# Patient Record
Sex: Female | Born: 1965 | Race: White | Hispanic: Yes | Marital: Married | Smoking: Never smoker
Health system: Southern US, Community
[De-identification: ages and names within clinical notes are randomized; demographics above are authoritative.]

## PROBLEM LIST (undated history)

## (undated) DIAGNOSIS — O24419 Gestational diabetes mellitus in pregnancy, unspecified control: Secondary | ICD-10-CM

## (undated) DIAGNOSIS — IMO0002 Reserved for concepts with insufficient information to code with codable children: Secondary | ICD-10-CM

## (undated) HISTORY — DX: Gestational diabetes mellitus in pregnancy, unspecified control: O24.419

## (undated) HISTORY — DX: Reserved for concepts with insufficient information to code with codable children: IMO0002

---

## 1998-02-20 ENCOUNTER — Other Ambulatory Visit: Admission: RE | Admit: 1998-02-20 | Discharge: 1998-02-20 | Payer: Self-pay | Admitting: Obstetrics and Gynecology

## 1998-06-02 ENCOUNTER — Other Ambulatory Visit: Admission: RE | Admit: 1998-06-02 | Discharge: 1998-06-02 | Payer: Self-pay | Admitting: Obstetrics and Gynecology

## 1998-11-07 ENCOUNTER — Encounter: Admission: RE | Admit: 1998-11-07 | Discharge: 1999-02-05 | Payer: Self-pay | Admitting: Gynecology

## 1999-02-16 ENCOUNTER — Inpatient Hospital Stay (HOSPITAL_COMMUNITY): Admission: AD | Admit: 1999-02-16 | Discharge: 1999-02-18 | Payer: Self-pay | Admitting: Obstetrics and Gynecology

## 1999-03-31 ENCOUNTER — Other Ambulatory Visit: Admission: RE | Admit: 1999-03-31 | Discharge: 1999-03-31 | Payer: Self-pay | Admitting: Obstetrics and Gynecology

## 1999-07-27 DIAGNOSIS — IMO0002 Reserved for concepts with insufficient information to code with codable children: Secondary | ICD-10-CM

## 1999-07-27 HISTORY — DX: Reserved for concepts with insufficient information to code with codable children: IMO0002

## 2000-04-13 ENCOUNTER — Other Ambulatory Visit: Admission: RE | Admit: 2000-04-13 | Discharge: 2000-04-13 | Payer: Self-pay | Admitting: Obstetrics and Gynecology

## 2000-07-07 ENCOUNTER — Other Ambulatory Visit: Admission: RE | Admit: 2000-07-07 | Discharge: 2000-07-07 | Payer: Self-pay | Admitting: Gynecology

## 2000-07-13 ENCOUNTER — Encounter: Admission: RE | Admit: 2000-07-13 | Discharge: 2000-10-11 | Payer: Self-pay | Admitting: Gynecology

## 2001-01-21 ENCOUNTER — Inpatient Hospital Stay (HOSPITAL_COMMUNITY): Admission: AD | Admit: 2001-01-21 | Discharge: 2001-01-22 | Payer: Self-pay | Admitting: Gynecology

## 2001-02-20 ENCOUNTER — Other Ambulatory Visit: Admission: RE | Admit: 2001-02-20 | Discharge: 2001-02-20 | Payer: Self-pay | Admitting: Gynecology

## 2001-09-19 ENCOUNTER — Other Ambulatory Visit: Admission: RE | Admit: 2001-09-19 | Discharge: 2001-09-19 | Payer: Self-pay | Admitting: Gynecology

## 2004-09-10 ENCOUNTER — Other Ambulatory Visit: Admission: RE | Admit: 2004-09-10 | Discharge: 2004-09-10 | Payer: Self-pay | Admitting: Gynecology

## 2005-07-13 ENCOUNTER — Other Ambulatory Visit: Admission: RE | Admit: 2005-07-13 | Discharge: 2005-07-13 | Payer: Self-pay | Admitting: Gynecology

## 2006-11-24 ENCOUNTER — Other Ambulatory Visit: Admission: RE | Admit: 2006-11-24 | Discharge: 2006-11-24 | Payer: Self-pay | Admitting: Gynecology

## 2009-12-04 ENCOUNTER — Ambulatory Visit: Payer: Self-pay | Admitting: Women's Health

## 2009-12-04 ENCOUNTER — Other Ambulatory Visit: Admission: RE | Admit: 2009-12-04 | Discharge: 2009-12-04 | Payer: Self-pay | Admitting: Gynecology

## 2010-12-07 ENCOUNTER — Encounter (INDEPENDENT_AMBULATORY_CARE_PROVIDER_SITE_OTHER): Payer: BC Managed Care – PPO | Admitting: Women's Health

## 2010-12-07 ENCOUNTER — Other Ambulatory Visit (HOSPITAL_COMMUNITY)
Admission: RE | Admit: 2010-12-07 | Discharge: 2010-12-07 | Disposition: A | Discharge status: Home / Self Care | Payer: BC Managed Care – PPO | Source: Ambulatory Visit | Attending: Gynecology | Admitting: Gynecology

## 2010-12-07 ENCOUNTER — Other Ambulatory Visit: Payer: Self-pay | Admitting: Women's Health

## 2010-12-07 DIAGNOSIS — R82998 Other abnormal findings in urine: Secondary | ICD-10-CM

## 2010-12-07 DIAGNOSIS — Z01419 Encounter for gynecological examination (general) (routine) without abnormal findings: Secondary | ICD-10-CM

## 2010-12-07 DIAGNOSIS — Z833 Family history of diabetes mellitus: Secondary | ICD-10-CM

## 2010-12-07 DIAGNOSIS — Z124 Encounter for screening for malignant neoplasm of cervix: Secondary | ICD-10-CM | POA: Insufficient documentation

## 2010-12-11 NOTE — Discharge Summary (Signed)
[facility]  Patient:    NISREEN, GUISE                      MRN: [id number] Adm. Date:  [id number] Disc. Date: [id number] Attending:  Merrily Pew Dictator:   Antony Contras, [facility]                           Discharge Summary  DISCHARGE DIAGNOSES:          1. Intrauterine pregnancy at term.                               2. Spontaneous onset of labor.  PROCEDURES:                   Normal spontaneous vaginal delivery of                               viable infant over intact perineum.  HISTORY OF PRESENT ILLNESS:   The patient is a 45 year old, gravida 4, para 3-0-0-3 with an EDC of January 28, 2001 by ultrasound. Prenatal course was uncomplicated.  PRENATAL LABORATORY DATA:     Blood type O positive, antibody screen negative. RPR, HBsAg, HIV nonreactive. Rubella immune. MSAFP and GBS negative.  HOSPITAL COURSE AND TREATMENT:                The patient was admitted on January 21, 2001 in active labor. She progressed quickly to complete dilatation and delivered an Apgar 9/9 female infant weighing 7 pounds 9 ounces over an intact perineum.  Postpartum course, she remained afebrile, had no difficulty voiding, and was able to be discharged on her first postpartum day in satisfactory condition.  CBC: Hematocrit 32.8, hemoglobin 11.4, WBCs 10.8, platelets 190,000.  DISPOSITION:                  The patient is to follow up in six weeks, continue with prenatal vitamins and iron, and Motrin and Tylox for pain. DD:  02/24/01 TD:  02/26/01 Job: [id number]

## 2011-10-18 ENCOUNTER — Ambulatory Visit (INDEPENDENT_AMBULATORY_CARE_PROVIDER_SITE_OTHER): Payer: BC Managed Care – PPO | Admitting: Physician Assistant

## 2011-10-18 VITALS — BP 126/77 | HR 71 | Temp 98.1°F | Resp 16 | Ht 65.75 in | Wt 157.0 lb

## 2011-10-18 DIAGNOSIS — T148 Other injury of unspecified body region: Secondary | ICD-10-CM

## 2011-10-18 DIAGNOSIS — W57XXXA Bitten or stung by nonvenomous insect and other nonvenomous arthropods, initial encounter: Secondary | ICD-10-CM

## 2011-10-18 MED ORDER — DOXYCYCLINE HYCLATE 100 MG PO TABS: 100.0000 mg | ORAL_TABLET | Freq: Two times a day (BID) | ORAL | Status: AC

## 2011-10-18 MED ORDER — MUPIROCIN 2 % EX OINT: TOPICAL_OINTMENT | Freq: Three times a day (TID) | CUTANEOUS | Status: AC

## 2011-10-18 NOTE — Progress Notes (Signed)
  Subjective:    Patient ID: Erika Tran, female    DOB: Aug 11, 1965, 46 y.o.   MRN: [id number]  HPI Ms. Leikam comes in today with a ? Insect bite on left forearm.  Was traveling to [place] last week with school field trip.  Noticed a red spot on arm 4 days ago.  Mostly itching.  Little pain.  No fever or chills.  No drainage from area.   Review of Systems As above    Objective:   Physical Exam Left forearm  Erythematous quarter sized area with central scabbed leison.  Slightly raised.  Deroofed scab and no drainage seen. No streaking.       Assessment & Plan:  Insect bite, left forearm.  I do not think there is any cellulitis at this point.  Zyrtec recommended.  Bactroban ointment bid tid.  RX for Doxy given as patient is traveling to [place] for several days with other child's class.  Area marked with pen and pt is to watch borders.  Call if sx worsen

## 2011-10-29 ENCOUNTER — Ambulatory Visit (INDEPENDENT_AMBULATORY_CARE_PROVIDER_SITE_OTHER): Payer: BC Managed Care – PPO | Admitting: Family Medicine

## 2011-10-29 VITALS — BP 104/67 | HR 86 | Temp 99.5°F | Resp 16 | Ht 65.75 in | Wt 155.6 lb

## 2011-10-29 DIAGNOSIS — R1110 Vomiting, unspecified: Secondary | ICD-10-CM

## 2011-10-29 DIAGNOSIS — R112 Nausea with vomiting, unspecified: Secondary | ICD-10-CM

## 2011-10-29 DIAGNOSIS — E860 Dehydration: Secondary | ICD-10-CM

## 2011-10-29 DIAGNOSIS — R197 Diarrhea, unspecified: Secondary | ICD-10-CM

## 2011-10-29 LAB — POCT CBC
Granulocyte percent: 71.9 %G (ref 37–80)
HCT, POC: 42.8 % (ref 37.7–47.9)
Hemoglobin: 14.3 g/dL (ref 12.2–16.2)
Lymph, poc: 1.7 (ref 0.6–3.4)
MCH, POC: 29.1 pg (ref 27–31.2)
MCHC: 33.4 g/dL (ref 31.8–35.4)
MCV: 87.2 fL (ref 80–97)
MID (cbc): 0.5 (ref 0–0.9)
MPV: 8.7 fL (ref 0–99.8)
POC Granulocyte: 5.8 (ref 2–6.9)
POC LYMPH PERCENT: 21.7 %L (ref 10–50)
POC MID %: 6.4 %M (ref 0–12)
Platelet Count, POC: 284 10*3/uL (ref 142–424)
RBC: 4.91 M/uL (ref 4.04–5.48)
RDW, POC: 13.1 %
WBC: 8 10*3/uL (ref 4.6–10.2)

## 2011-10-29 MED ORDER — ONDANSETRON 4 MG PO TBDP
4.0000 mg | ORAL_TABLET | Freq: Once | ORAL | Status: AC
  Administered 2011-10-29: 4 mg via ORAL

## 2011-10-29 MED ORDER — ONDANSETRON 8 MG PO TBDP: 8.0000 mg | ORAL_TABLET | Freq: Three times a day (TID) | ORAL | Status: AC | PRN

## 2011-10-29 MED ORDER — SODIUM CHLORIDE 0.9 % IV SOLN: 10000.0000 mL | Freq: Once | INTRAVENOUS | Status: DC

## 2011-10-29 NOTE — Patient Instructions (Signed)
Nausea and Vomiting  Nausea is a sick feeling that often comes before throwing up (vomiting). Vomiting is a reflex where stomach contents come out of your mouth. Vomiting can cause severe loss of body fluids (dehydration). Children and elderly adults can become dehydrated quickly, especially if they also have diarrhea. Nausea and vomiting are symptoms of a condition or disease. It is important to find the cause of your symptoms.  CAUSES    Direct irritation of the stomach lining. This irritation can result from increased acid production (gastroesophageal reflux disease), infection, food poisoning, taking certain medicines (such as nonsteroidal anti-inflammatory drugs), alcohol use, or tobacco use.   Signals from the brain.These signals could be caused by a headache, heat exposure, an inner ear disturbance, increased pressure in the brain from injury, infection, a tumor, or a concussion, pain, emotional stimulus, or metabolic problems.   An obstruction in the gastrointestinal tract (bowel obstruction).   Illnesses such as diabetes, hepatitis, gallbladder problems, appendicitis, kidney problems, cancer, sepsis, atypical symptoms of a heart attack, or eating disorders.   Medical treatments such as chemotherapy and radiation.   Receiving medicine that makes you sleep (general anesthetic) during surgery.  DIAGNOSIS  Your caregiver may ask for tests to be done if the problems do not improve after a few days. Tests may also be done if symptoms are severe or if the reason for the nausea and vomiting is not clear. Tests may include:   Urine tests.   Blood tests.   Stool tests.   Cultures (to look for evidence of infection).   X-rays or other imaging studies.  Test results can help your caregiver make decisions about treatment or the need for additional tests.  TREATMENT  You need to stay well hydrated. Drink frequently but in small amounts.You may wish to drink water, sports drinks, clear broth, or eat frozen  ice pops or gelatin dessert to help stay hydrated.When you eat, eating slowly may help prevent nausea.There are also some antinausea medicines that may help prevent nausea.  HOME CARE INSTRUCTIONS    Take all medicine as directed by your caregiver.   If you do not have an appetite, do not force yourself to eat. However, you must continue to drink fluids.   If you have an appetite, eat a normal diet unless your caregiver tells you differently.   Eat a variety of complex carbohydrates (rice, wheat, potatoes, bread), lean meats, yogurt, fruits, and vegetables.   Avoid high-fat foods because they are more difficult to digest.   Drink enough water and fluids to keep your urine clear or pale yellow.   If you are dehydrated, ask your caregiver for specific rehydration instructions. Signs of dehydration may include:   Severe thirst.   Dry lips and mouth.   Dizziness.   Dark urine.   Decreasing urine frequency and amount.   Confusion.   Rapid breathing or pulse.  SEEK IMMEDIATE MEDICAL CARE IF:    You have blood or brown flecks (like coffee grounds) in your vomit.   You have black or bloody stools.   You have a severe headache or stiff neck.   You are confused.   You have severe abdominal pain.   You have chest pain or trouble breathing.   You do not urinate at least once every 8 hours.   You develop cold or clammy skin.   You continue to vomit for longer than 24 to 48 hours.   You have a fever.  MAKE SURE YOU:      Understand these instructions.   Will watch your condition.   Will get help right away if you are not doing well or get worse.  Document Released: 07/12/2005 Document Revised: 07/01/2011 Document Reviewed: 12/09/2010  ExitCare Patient Information 2012 [employer].

## 2011-10-29 NOTE — Progress Notes (Signed)
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  Developed acute n,v and diarrhea yesterday afternoon.  No blood in stool.  Vomited just before arriving here around 2 pm.  Feels a little light headed.  O:  Alert, NAD BP:  90/70 sitting;  70/0 standing. Chest:  Clear Heart:  Reg, no murmur or gallop Abd: soft, nontender with no HSM Skin: some tenting on back of hand, no jaundice  A: sx c/w norovirus;  Dehydrated.  P:

## 2012-02-11 ENCOUNTER — Encounter: Payer: Self-pay | Admitting: Women's Health

## 2012-02-11 ENCOUNTER — Telehealth: Payer: Self-pay | Admitting: Gynecology

## 2012-02-11 ENCOUNTER — Ambulatory Visit (INDEPENDENT_AMBULATORY_CARE_PROVIDER_SITE_OTHER): Payer: BC Managed Care – PPO | Admitting: Women's Health

## 2012-02-11 VITALS — BP 110/70 | Ht 66.25 in | Wt 156.0 lb

## 2012-02-11 DIAGNOSIS — Z01419 Encounter for gynecological examination (general) (routine) without abnormal findings: Secondary | ICD-10-CM

## 2012-02-11 DIAGNOSIS — Z13220 Encounter for screening for lipoid disorders: Secondary | ICD-10-CM

## 2012-02-11 DIAGNOSIS — Z833 Family history of diabetes mellitus: Secondary | ICD-10-CM

## 2012-02-11 DIAGNOSIS — E079 Disorder of thyroid, unspecified: Secondary | ICD-10-CM

## 2012-02-11 LAB — CBC WITH DIFFERENTIAL/PLATELET
Basophils Absolute: 0 10*3/uL (ref 0.0–0.1)
Basophils Relative: 0 % (ref 0–1)
Eosinophils Relative: 3 % (ref 0–5)
HCT: 39.4 % (ref 36.0–46.0)
Hemoglobin: 13.5 g/dL (ref 12.0–15.0)
Lymphocytes Relative: 33 % (ref 12–46)
MCHC: 34.3 g/dL (ref 30.0–36.0)
MCV: 85.3 fL (ref 78.0–100.0)
Monocytes Absolute: 0.4 10*3/uL (ref 0.1–1.0)
Monocytes Relative: 6 % (ref 3–12)
RDW: 13 % (ref 11.5–15.5)

## 2012-02-11 LAB — LIPID PANEL
Cholesterol: 131 mg/dL (ref 0–200)
HDL: 49 mg/dL (ref >39)
Triglycerides: 43 mg/dL (ref <150)
VLDL: 9 mg/dL (ref 0–40)

## 2012-02-11 NOTE — Progress Notes (Signed)
Erika Tran November 01, 1965 [id number]    History:    The patient presents for annual exam. Light 2-3 day cycle most months/Mirena IUD-placed 2008. Hx normal Paps. Never had mammogram. Reports 20 lb. weight gain over past 2-3 years, contributes to diet and stress.    Past medical history, past surgical history, family history and social history were all reviewed and documented in the EPIC chart.  Hx GDM 3rd pregnancy. Stay at home mom, cleans homes. 4 children, oldest at [employer] (rising sophomore).    ROS:  A  ROS was performed and pertinent positives and negatives are included in the history.  Exam:  Filed Vitals:   02/11/12 0807  BP: 110/70    General appearance:  Normal Head/Neck:  Normal, without cervical or supraclavicular adenopathy. Thyroid:  Symmetrical, normal in size, without palpable masses or nodularity. Respiratory  Effort:  Normal  Auscultation:  Clear without wheezing or rhonchi Cardiovascular  Auscultation:  Regular rate, without rubs, murmurs or gallops  Edema/varicosities:  Not grossly evident Abdominal  Soft,nontender, without masses, guarding or rebound.  Liver/spleen:  No organomegaly noted  Hernia:  None appreciated  Skin  Inspection:  Grossly normal  Palpation:  Grossly normal Neurologic/psychiatric  Orientation:  Normal with appropriate conversation.  Mood/affect:  Normal  Genitourinary    Breasts: Examined lying and sitting.     Right: Without masses, retractions, discharge or axillary adenopathy.     Left: Without masses, retractions, discharge or axillary adenopathy.   Inguinal/mons:  Normal without inguinal adenopathy  External genitalia:  Normal  BUS/Urethra/Skene's glands:  Normal  Bladder:  Normal  Vagina:  Normal  Cervix:  Normal, Blue Mirena IUD strings visible.   Uterus:  Normal in size, shape and contour.  Midline and mobile  Adnexa/parametria:     Rt: Without masses or tenderness.   Lt: Without masses or tenderness.  Anus and  perineum: Normal  Digital rectal exam: Normal sphincter tone without palpated masses or tenderness  Assessment/Plan:  46 y.o.  MHF G4P4 for annual exam with complaints of gradual weight gain over past 2-3 years.  Normal gyn exam/Mirena IUD-placed 2008 Weight Gain-over past 2-3 years  Plan: CBC, U/A, TSH, Glu, Lipid panel. No Pap, new screening guidelines reviewed. Has never had mammogram, instructed to schedule and encouraged yearly mammograms. Daily exercise, cutting calories and carbs for weight loss, SBE's, Vit. D 1000  daily, and Ca rich diet encouraged. Gardasil vaccine reviewed for children.  Mirena IUD due to be removed/replaced, will schedule with Dr. Audie Box.     Harrington Challenger [facility], 8:48 AM 02/11/2012

## 2012-02-11 NOTE — Telephone Encounter (Signed)
Patient informed that I spoke with [place] at [facility] and was told her new IUD and insertion covered at 100%.  However, there will be a $25 copayment for the removal of the IUD she has in.  Appt is schedule for 02/29/12.

## 2012-02-11 NOTE — Patient Instructions (Signed)

## 2012-02-12 LAB — URINALYSIS W MICROSCOPIC + REFLEX CULTURE
Glucose, UA: NEGATIVE mg/dL
Hgb urine dipstick: NEGATIVE
Leukocytes, UA: NEGATIVE
Nitrite: NEGATIVE
Protein, ur: NEGATIVE mg/dL
Squamous Epithelial / LPF: NONE SEEN
Urobilinogen, UA: 0.2 mg/dL (ref 0.0–1.0)

## 2012-02-15 ENCOUNTER — Telehealth: Payer: Self-pay | Admitting: Women's Health

## 2012-02-15 ENCOUNTER — Other Ambulatory Visit: Payer: Self-pay | Admitting: Gynecology

## 2012-02-15 DIAGNOSIS — Z3049 Encounter for surveillance of other contraceptives: Secondary | ICD-10-CM

## 2012-02-15 MED ORDER — LEVONORGESTREL 20 MCG/24HR IU IUD: INTRAUTERINE_SYSTEM | Freq: Once | INTRAUTERINE | Status: AC

## 2012-02-15 NOTE — Telephone Encounter (Signed)
Patient said she has a "baby hernia from having a baby".  She said at the visit you asked her did it bother her and she said no.  However, she has since noticed that it does and the last few days more than usual and thinking back she realized it had bothered her at other times.  She wondered if she needed to have it checked by someone and if you thought that was a good idea?  She asked if you would be calling her and I told her you may just have me call her back as probably next step will be to go see the general surgeon and let him check it to see if it looks like it needs surgical repair or you may have time to call her. She is fine with either. Patient knows [place] off today and we will call her tomorrow.

## 2012-02-16 NOTE — Telephone Encounter (Signed)
Message left on cell and home number

## 2012-02-17 NOTE — Telephone Encounter (Signed)
message left on cell

## 2012-02-17 NOTE — Telephone Encounter (Signed)
Telephone call, states has had slight discomfort at umbilicus yesterday, none today. Reviewed if would have continued discomfort, burning sensation, aching then would need to see a general surgeon for possible hernia repair. Denies any problems with constipation or changes in elimination or nausea. Will watch at this time but will call if continued problems.

## 2012-02-29 ENCOUNTER — Ambulatory Visit: Payer: BC Managed Care – PPO | Admitting: Gynecology

## 2012-03-13 ENCOUNTER — Ambulatory Visit (INDEPENDENT_AMBULATORY_CARE_PROVIDER_SITE_OTHER): Payer: BC Managed Care – PPO | Admitting: Gynecology

## 2012-03-13 ENCOUNTER — Encounter: Payer: Self-pay | Admitting: Gynecology

## 2012-03-13 ENCOUNTER — Ambulatory Visit: Payer: BC Managed Care – PPO | Admitting: Gynecology

## 2012-03-13 DIAGNOSIS — Z3049 Encounter for surveillance of other contraceptives: Secondary | ICD-10-CM

## 2012-03-13 NOTE — Progress Notes (Signed)
Patient presents to have her Mirena IUD were placed. Unfortunately she had it placed May 2008 it has had intercourse 2 nights ago. She is not on a menses now. Discussed with her that I am uncomfortable replacing it now past her due date which is not on her menses and had recent intercourse. She will need to abstain at least 2 weeks from intercourse and then check a pregnancy test or represent during a regular menses flow to have this replaced. I reviewed with her the past 35 years we cannot guarantee contraceptive efficacy. The patient understands and will reschedule.

## 2012-03-13 NOTE — Patient Instructions (Signed)
Call if any problems. Otherwise follow up in one month for IUD recheck.

## 2012-03-17 ENCOUNTER — Ambulatory Visit: Payer: BC Managed Care – PPO | Admitting: Gynecology

## 2012-03-28 ENCOUNTER — Telehealth: Payer: Self-pay | Admitting: *Deleted

## 2012-03-28 ENCOUNTER — Encounter: Payer: Self-pay | Admitting: Gynecology

## 2012-03-28 ENCOUNTER — Ambulatory Visit (INDEPENDENT_AMBULATORY_CARE_PROVIDER_SITE_OTHER): Payer: BC Managed Care – PPO | Admitting: Gynecology

## 2012-03-28 DIAGNOSIS — K429 Umbilical hernia without obstruction or gangrene: Secondary | ICD-10-CM

## 2012-03-28 DIAGNOSIS — Z30433 Encounter for removal and reinsertion of intrauterine contraceptive device: Secondary | ICD-10-CM

## 2012-03-28 DIAGNOSIS — N912 Amenorrhea, unspecified: Secondary | ICD-10-CM

## 2012-03-28 NOTE — Progress Notes (Signed)
Patient presents for Mirena IUD removal and replacement. She has read through the booklet, has no contraindications and signed the consent form. She remains amenorrheic. She has a negative UPT today after over 2 weeks of abstinence.  I reviewed the insertional process with her as well as the risks to include infection either immediate or long-term, uterine perforation or migration requiring surgery to remove, other complications such as pain, hormonal side effects and possibilities of failure with subsequent pregnancy.    Exam with Sherrilyn Rist assistant Pelvic: External BUS vagina normal. Cervix normal IUD string visualized. Uterus axial, normal size shape contour midline mobile nontender. Adnexa without masses or tenderness.  Procedure: The cervix was cleansed with Betadine and the IUD string was grasped with a [place] forcep and the old IUD was removed, shown to the patient and discarded. The anterior lip of the cervix was grasped with a single-tooth tenaculum, the uterus was sounded and a new Mirena IUD was placed according to manufacturer's recommendations without difficulty. The strings were trimmed. The patient tolerated well and will follow up in one month for a postinsertional check.  Lot number:[id number]

## 2012-03-28 NOTE — Telephone Encounter (Signed)
App. Set for 04/03/12 @ 2:10 pm with dr. Daphine Deutscher at [facility] [place] [facility]. Pt mail box full unable to leave message will try back later.

## 2012-03-28 NOTE — Telephone Encounter (Signed)
Message copied by Aura Camps on Tue Mar 28, 2012 12:44 PM ------      Message from: Dara Lords      Created: Tue Mar 28, 2012  9:50 AM       Schedule appt with general surgeon ie Tawanna Cooler Rosenbauer/ Todd Gerkin/Matt Highland reference umbilical hernia

## 2012-03-28 NOTE — Patient Instructions (Signed)
Intrauterine Device Insertion Most often, an intrauterine device (IUD) is inserted into the uterus to prevent pregnancy. There are 2 types of IUDs available:  Copper IUD. This type of IUD creates an environment that is not favorable to sperm survival. The mechanism of action of the copper IUD is not known for certain. It can stay in place for 10 years.   Hormone IUD. This type of IUD contains the hormone progestin (synthetic progesterone). The progestin thickens the cervical mucus and prevents sperm from entering the uterus, and it also thins the uterine lining. There is no evidence that the hormone IUD prevents implantation. The hormone IUD can stay in place for up to 5 years.  An IUD is the most cost-effective birth control if left in place for the full duration. It may be removed at any time. LET YOUR CAREGIVER KNOW ABOUT:  Sensitivity to metals.   Medicines taken including herbs, eyedrops, over-the-counter medicines, and creams.   Use of steroids (by mouth or creams).   Previous problems with anesthetics or numbing medicine.   Previous gynecological surgery.   History of blood clots or clotting disorders.   Possibility of pregnancy.   Menstrual irregularities.   Concerns regarding unusual vaginal discharge or odors.   Previous experience with an IUD.   Other health problems.  RISKS AND COMPLICATIONS  Accidental puncture (perforation) of the uterus.   Accidental placement of the IUD either in the muscle layer of the uterus (myometrium) or outside the uterus. If this happen, the IUD can be found essentially floating around the bowels. When this happens, the IUD must be taken out surgically.   The IUD may fall out of the uterus (expulsion). This is more common in women who have recently had a child.    Pregnancy in the fallopian tube (ectopic).  BEFORE THE PROCEDURE  Schedule the IUD insertion for when you will have your menstrual period or right after, to make sure you  are not pregnant. Placement of the IUD is better tolerated shortly after a menstrual cycle.   You may need to take tests or be examined to make sure you are not pregnant.   You may be required to take a pregnancy test.   You may be required to get checked for sexually transmitted infections (STIs) prior to placement. Placing an IUD in someone who has an infection can make an infection worse.   You may be given a pain reliever to take 1 or 2 hours before the procedure.   An exam will be performed to determine the size and position of your uterus.   Ask your caregiver about changing or stopping your regular medicines.  PROCEDURE   A tool (speculum) is placed in the vagina. This allows your caregiver to see the lower part of the uterus (cervix).   The cervix is prepped with a medicine that lowers the risk of infection.   You may be given a medicine to numb each side of the cervix (intracervical or paracervical block). This is used to block and control any discomfort with insertion.   A tool (uterine sound) is inserted into the uterus to determine the length of the uterine cavity and the direction the uterus may be tilted.   A slim instrument (IUD inserter) is inserted through the cervical canal and into your uterus.   The IUD is placed in the uterine cavity and the insertion device is removed.   The nylon string that is attached to the IUD, and used for   eventual IUD removal, is trimmed. It is trimmed so that it lays high in the vagina, just outside the cervix.  AFTER THE PROCEDURE  You may have bleeding after the procedure. This is normal. It varies from light spotting for a few days to menstrual-like bleeding.   You may have mild cramping.   Practice checking the string coming out of the cervix to make sure the IUD remains in the uterus. If you cannot feel the string, you should schedule a "string check" with your caregiver.   If you had a hormone IUD inserted, expect that your  period may be lighter or nonexistent within a year's time (though this is not always the case). There may be delayed fertility with the hormone IUD as a result of its progesterone effect. When you are ready to become pregnant, it is suggested to have the IUD removed up to 1 year in advance.   Yearly exams are advised.  Document Released: 03/10/2011 Document Revised: 07/01/2011 Document Reviewed: 03/10/2011 ExitCare Patient Information 2012 [employer]. 

## 2012-03-29 NOTE — Telephone Encounter (Signed)
Left message for pt to call.

## 2012-04-03 ENCOUNTER — Encounter (INDEPENDENT_AMBULATORY_CARE_PROVIDER_SITE_OTHER): Payer: Self-pay | Admitting: Surgery

## 2012-04-03 ENCOUNTER — Ambulatory Visit (INDEPENDENT_AMBULATORY_CARE_PROVIDER_SITE_OTHER): Payer: BC Managed Care – PPO | Admitting: Surgery

## 2012-04-03 VITALS — BP 116/78 | HR 64 | Temp 97.2°F | Resp 12 | Ht 67.0 in | Wt 157.4 lb

## 2012-04-03 DIAGNOSIS — K429 Umbilical hernia without obstruction or gangrene: Secondary | ICD-10-CM | POA: Insufficient documentation

## 2012-04-03 HISTORY — DX: Umbilical hernia without obstruction or gangrene: K42.9

## 2012-04-03 NOTE — Telephone Encounter (Signed)
Leave message on pt voice mail with the below time & date with # to reschedule appointment if needed.

## 2012-04-03 NOTE — Progress Notes (Signed)
CC  Umbilical hernia  Erika Tran comes in today in followup. She is self-employed and cleans houses. She noticed with her weight going up a little bit that there is an umbilical hernia but a little more prominent. She is seeing Dr. Chiquita Loth and his nurse practitioner Orlan Leavens about this and he referred her for evaluation. She denies any history of nausea or vomiting. She's had 4 children vaginal births.  On examination there is a soft mass about a centimeter in diameter consistent with preperitoneal fat probably come out through a much smaller fascial defect. This is likely too small to accommodate any knuckle of bowel. I told her that it is not bothering her I would tend to manage this expectantly. If he gets larger and we can certainly repair it likely using a piece of mesh. I told her what to look for in terms of bowel obstruction with nausea or vomiting into CT medical attention promptly should that occur.  Impression a small umbilical hernia containing fat. Will observe at the present. Will repair if it becomes more symptomatic or gets larger.

## 2012-04-03 NOTE — Patient Instructions (Addendum)
Thanks for your patience.  If you need further assistance after leaving the office, please call our office and speak with a [facility] nurse.  [phone].  If you want to leave a message for Dr. Pete Schnitzer, please call his office phone at [phone]. 

## 2012-04-27 ENCOUNTER — Ambulatory Visit: Payer: BC Managed Care – PPO | Admitting: Gynecology

## 2012-05-03 ENCOUNTER — Ambulatory Visit: Payer: BC Managed Care – PPO | Admitting: Gynecology

## 2012-08-11 ENCOUNTER — Ambulatory Visit (INDEPENDENT_AMBULATORY_CARE_PROVIDER_SITE_OTHER): Payer: BC Managed Care – PPO | Admitting: Family Medicine

## 2012-08-11 ENCOUNTER — Ambulatory Visit: Payer: BC Managed Care – PPO

## 2012-08-11 VITALS — BP 122/88 | HR 74 | Temp 98.6°F | Resp 16 | Ht 65.5 in | Wt 151.4 lb

## 2012-08-11 DIAGNOSIS — R05 Cough: Secondary | ICD-10-CM

## 2012-08-11 DIAGNOSIS — R0600 Dyspnea, unspecified: Secondary | ICD-10-CM

## 2012-08-11 DIAGNOSIS — J189 Pneumonia, unspecified organism: Secondary | ICD-10-CM

## 2012-08-11 DIAGNOSIS — R0609 Other forms of dyspnea: Secondary | ICD-10-CM

## 2012-08-11 DIAGNOSIS — R062 Wheezing: Secondary | ICD-10-CM

## 2012-08-11 DIAGNOSIS — R059 Cough, unspecified: Secondary | ICD-10-CM

## 2012-08-11 LAB — POCT CBC
Granulocyte percent: 69.8 %G (ref 37–80)
HCT, POC: 45.2 % (ref 37.7–47.9)
Hemoglobin: 14.3 g/dL (ref 12.2–16.2)
Lymph, poc: 2.6 (ref 0.6–3.4)
MCH, POC: 28.4 pg (ref 27–31.2)
MCHC: 31.6 g/dL — AB (ref 31.8–35.4)
MCV: 89.8 fL (ref 80–97)
MID (cbc): 0.8 (ref 0–0.9)
MPV: 9 fL (ref 0–99.8)
POC Granulocyte: 7.7 — AB (ref 2–6.9)
POC LYMPH PERCENT: 23 %L (ref 10–50)
POC MID %: 7.2 %M (ref 0–12)
Platelet Count, POC: 452 10*3/uL — AB (ref 142–424)
RBC: 5.03 M/uL (ref 4.04–5.48)
RDW, POC: 12.9 %
WBC: 11.1 10*3/uL — AB (ref 4.6–10.2)

## 2012-08-11 MED ORDER — ALBUTEROL SULFATE HFA 108 (90 BASE) MCG/ACT IN AERS: 2.0000 | INHALATION_SPRAY | RESPIRATORY_TRACT | Status: DC | PRN

## 2012-08-11 MED ORDER — CEFDINIR 300 MG PO CAPS: 300.0000 mg | ORAL_CAPSULE | Freq: Two times a day (BID) | ORAL | Status: DC

## 2012-08-11 MED ORDER — ALBUTEROL SULFATE (2.5 MG/3ML) 0.083% IN NEBU: 2.5000 mg | INHALATION_SOLUTION | Freq: Once | RESPIRATORY_TRACT | Status: DC

## 2012-08-11 MED ORDER — HYDROCODONE-HOMATROPINE 5-1.5 MG/5ML PO SYRP: 5.0000 mL | ORAL_SOLUTION | Freq: Three times a day (TID) | ORAL | Status: DC | PRN

## 2012-08-11 MED ORDER — LEVALBUTEROL HCL 0.63 MG/3ML IN NEBU
0.6300 mg | INHALATION_SOLUTION | Freq: Once | RESPIRATORY_TRACT | Status: AC
  Administered 2012-08-11: 0.63 mg via RESPIRATORY_TRACT

## 2012-08-11 NOTE — Progress Notes (Signed)
  Subjective:    Patient ID: Erika Tran, female    DOB: 10-16-1965, 47 y.o.   MRN: [id number]  HPI  47 year old female presents with 1 week history of cough, slight nasal congestion, shortness of breath, and wheezing.  States on 08/03/12 she believes she got the flu - had high fever, body aches, chills, with cough.  Says fever broke about 3 days ago, but the cough and shortness of breath have persisted.  She is afebrile today.  Has been taking Dayquil and using lozenges for her sore throat, but symptoms have persisted.  She is otherwise healthy without any other concerns today.     Review of Systems  Constitutional: Negative for fever and chills.  HENT: Positive for congestion, sore throat and rhinorrhea. Negative for ear pain.   Respiratory: Positive for cough, shortness of breath and wheezing. Negative for chest tightness.   Cardiovascular: Negative for chest pain.  Gastrointestinal: Negative for nausea, vomiting and abdominal pain.  Skin: Negative for rash.  Neurological: Negative for dizziness and headaches.       Objective:   Physical Exam  Constitutional: She is oriented to person, place, and time. She appears well-developed and well-nourished.  HENT:  Head: Normocephalic and atraumatic.  Right Ear: External ear normal.  Left Ear: External ear normal.  Mouth/Throat: Oropharynx is clear and moist. No oropharyngeal exudate.  Eyes: Conjunctivae normal are normal.  Neck: Normal range of motion.  Cardiovascular: Normal rate, regular rhythm and normal heart sounds.   Pulmonary/Chest: Effort normal. She has wheezes.  Lymphadenopathy:    She has no cervical adenopathy.  Neurological: She is alert and oriented to person, place, and time.  Psychiatric: She has a normal mood and affect. Her behavior is normal. Judgment and thought content normal.      [facility] reading (PRIMARY) by  Dr. Patsy Lager as mild RLL pneumonia.       Assessment & Plan:   1. Cough  POCT CBC, DG Chest 2 View    2. Dyspnea  DG Chest 2 View  3. Wheezing  DISCONTINUED: albuterol (PROVENTIL) (2.5 MG/3ML) 0.083% nebulizer solution 2.5 mg   Omnicef 300 mg bid x 10 days Albuterol inhaler q4-6hours prn wheezing Hycodan qhs prn cough Follow up if symptoms worsen or fail to improve, specifically if she develops fever, chills, or increasing dyspnea.

## 2013-10-30 ENCOUNTER — Other Ambulatory Visit (HOSPITAL_COMMUNITY)
Admission: RE | Admit: 2013-10-30 | Discharge: 2013-10-30 | Disposition: A | Discharge status: Home / Self Care | Payer: Managed Care, Other (non HMO) | Source: Ambulatory Visit | Attending: Gynecology | Admitting: Gynecology

## 2013-10-30 ENCOUNTER — Other Ambulatory Visit: Payer: Self-pay

## 2013-10-30 ENCOUNTER — Encounter: Payer: Self-pay | Admitting: Women's Health

## 2013-10-30 ENCOUNTER — Ambulatory Visit (INDEPENDENT_AMBULATORY_CARE_PROVIDER_SITE_OTHER): Payer: Managed Care, Other (non HMO) | Admitting: Women's Health

## 2013-10-30 VITALS — BP 120/74 | Ht 66.0 in | Wt 154.0 lb

## 2013-10-30 DIAGNOSIS — Z8632 Personal history of gestational diabetes: Secondary | ICD-10-CM

## 2013-10-30 DIAGNOSIS — Z01419 Encounter for gynecological examination (general) (routine) without abnormal findings: Secondary | ICD-10-CM | POA: Insufficient documentation

## 2013-10-30 DIAGNOSIS — Z833 Family history of diabetes mellitus: Secondary | ICD-10-CM

## 2013-10-30 DIAGNOSIS — Z1231 Encounter for screening mammogram for malignant neoplasm of breast: Secondary | ICD-10-CM

## 2013-10-30 HISTORY — DX: Personal history of gestational diabetes: Z86.32

## 2013-10-30 LAB — CBC WITH DIFFERENTIAL/PLATELET
BASOS PCT: 0 % (ref 0–1)
Basophils Absolute: 0 10*3/uL (ref 0.0–0.1)
EOS ABS: 0.1 10*3/uL (ref 0.0–0.7)
Eosinophils Relative: 2 % (ref 0–5)
HCT: 39.9 % (ref 36.0–46.0)
HEMOGLOBIN: 13.9 g/dL (ref 12.0–15.0)
Lymphocytes Relative: 33 % (ref 12–46)
Lymphs Abs: 2.1 10*3/uL (ref 0.7–4.0)
MCH: 29.3 pg (ref 26.0–34.0)
MCHC: 34.8 g/dL (ref 30.0–36.0)
MCV: 84 fL (ref 78.0–100.0)
MONOS PCT: 7 % (ref 3–12)
Monocytes Absolute: 0.5 10*3/uL (ref 0.1–1.0)
NEUTROS PCT: 58 % (ref 43–77)
Neutro Abs: 3.8 10*3/uL (ref 1.7–7.7)
Platelets: 312 10*3/uL (ref 150–400)
RBC: 4.75 MIL/uL (ref 3.87–5.11)
RDW: 13.4 % (ref 11.5–15.5)
WBC: 6.5 10*3/uL (ref 4.0–10.5)

## 2013-10-30 LAB — GLUCOSE, RANDOM: GLUCOSE: 72 mg/dL (ref 70–99)

## 2013-10-30 NOTE — Progress Notes (Signed)
Erika Tran 09/11/1965 [id number]    History:    Presents for annual exam.  Rare bleeding on Mirena IUD placed 03/2012. History of LGSIL 2001 with normal Paps after. Gestational diabetes with one pregnancy. Had pneumonia 2014. Has not had a mammogram.  Past medical history, past surgical history, family history and social history were all reviewed and documented in the EPIC chart.  4 children son graduating from [employer] doing a semester in [place], one at [employer], 2 in middle school. 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 Father hypertension. Paternal grandmother breast cancer. Excellent lipid panel 2013.  ROS:  A  ROS was performed and pertinent positives and negatives are included.  Exam:  Filed Vitals:   10/30/13 0853  BP: 120/74    General appearance:  Normal Thyroid:  Symmetrical, normal in size, without palpable masses or nodularity. Respiratory  Auscultation:  Clear without wheezing or rhonchi Cardiovascular  Auscultation:  Regular rate, without rubs, murmurs or gallops  Edema/varicosities:  Not grossly evident Abdominal  Soft,nontender, without masses, guarding or rebound.  Liver/spleen:  No organomegaly noted  Hernia:  Small umbilical hernia/nontender  Skin  Inspection:  Grossly normal/or melasama   Breasts: Examined lying and sitting.     Right: Without masses, retractions, discharge or axillary adenopathy.     Left: Without masses, retractions, discharge or axillary adenopathy. Gentitourinary   Inguinal/mons:  Normal without inguinal adenopathy  External genitalia:  Normal  BUS/Urethra/Skene's glands:  Normal  Vagina:  Normal  Cervix:  Normal IUD string in os  Uterus:  normal in size, shape and contour.  Midline and mobile  Adnexa/parametria:     Rt: Without masses or tenderness.   Lt: Without masses or tenderness.  Anus and perineum: Normal  Digital rectal exam: Normal sphincter tone without palpated masses or tenderness  Assessment/Plan:  48 y.o. MWF G4P4 for  annual exam.    Rare bleeding Mirena IUD placed 03/2012 Pneumonia 2014 LGSIL 2001  Plan: Having skin changes/melasma,  continue dermatologist recommendations. Options reviewed to have IUD removed,ParaGard placed will think about. SBE's, reviewed importance of annual screening mammogram and instructed to schedule ASAP. Continue active lifestyle, calcium rich diet, vitamin D 1000 daily encouraged. CBC, glucose, UA, Pap with HR HPV typing.    Harrington ChallengerYOUNG,NANCY J [facility]WHNP, 9:33 AM 10/30/2013

## 2013-10-30 NOTE — Addendum Note (Signed)
Addended by: Dayna BarkerGARDNER, KIMBERLY K on: 10/30/2013 09:50 AM   Modules accepted: Orders

## 2013-10-30 NOTE — Patient Instructions (Signed)

## 2013-10-31 ENCOUNTER — Encounter: Payer: BC Managed Care – PPO | Admitting: Women's Health

## 2013-10-31 LAB — URINALYSIS W MICROSCOPIC + REFLEX CULTURE
BILIRUBIN URINE: NEGATIVE
CRYSTALS: NONE SEEN
Casts: NONE SEEN
GLUCOSE, UA: NEGATIVE mg/dL
Hgb urine dipstick: NEGATIVE
Ketones, ur: NEGATIVE mg/dL
Leukocytes, UA: NEGATIVE
Nitrite: NEGATIVE
Protein, ur: NEGATIVE mg/dL
SPECIFIC GRAVITY, URINE: 1.026 (ref 1.005–1.030)
Urobilinogen, UA: 0.2 mg/dL (ref 0.0–1.0)
pH: 6 (ref 5.0–8.0)

## 2013-11-01 ENCOUNTER — Other Ambulatory Visit: Payer: Self-pay | Admitting: Women's Health

## 2013-11-01 MED ORDER — SULFAMETHOXAZOLE-TMP DS 800-160 MG PO TABS: 1.0000 | ORAL_TABLET | Freq: Two times a day (BID) | ORAL | Status: DC

## 2013-11-04 LAB — URINE CULTURE

## 2013-11-19 ENCOUNTER — Ambulatory Visit: Payer: BC Managed Care – PPO

## 2014-05-27 ENCOUNTER — Encounter: Payer: Self-pay | Admitting: Women's Health

## 2014-12-27 ENCOUNTER — Encounter: Payer: Managed Care, Other (non HMO) | Admitting: Women's Health

## 2015-01-09 ENCOUNTER — Encounter: Payer: Managed Care, Other (non HMO) | Admitting: Women's Health

## 2015-05-07 ENCOUNTER — Encounter: Payer: Self-pay | Admitting: Women's Health

## 2015-05-07 ENCOUNTER — Ambulatory Visit (INDEPENDENT_AMBULATORY_CARE_PROVIDER_SITE_OTHER): Payer: Managed Care, Other (non HMO) | Admitting: Women's Health

## 2015-05-07 VITALS — BP 110/76 | Ht 66.0 in | Wt 157.6 lb

## 2015-05-07 DIAGNOSIS — Z01419 Encounter for gynecological examination (general) (routine) without abnormal findings: Secondary | ICD-10-CM

## 2015-05-07 DIAGNOSIS — N898 Other specified noninflammatory disorders of vagina: Secondary | ICD-10-CM

## 2015-05-07 LAB — LIPID PANEL
Cholesterol: 121 mg/dL — ABNORMAL LOW (ref 125–200)
HDL: 49 mg/dL (ref >=46)
LDL Cholesterol: 65 mg/dL (ref <130)
Total CHOL/HDL Ratio: 2.5 Ratio (ref <=5.0)
Triglycerides: 35 mg/dL (ref <150)
VLDL: 7 mg/dL (ref <30)

## 2015-05-07 LAB — CBC WITH DIFFERENTIAL/PLATELET
Basophils Absolute: 0.1 10*3/uL (ref 0.0–0.1)
Basophils Relative: 1 % (ref 0–1)
EOS PCT: 3 % (ref 0–5)
Eosinophils Absolute: 0.2 10*3/uL (ref 0.0–0.7)
HCT: 41 % (ref 36.0–46.0)
Hemoglobin: 14.1 g/dL (ref 12.0–15.0)
LYMPHS ABS: 2.3 10*3/uL (ref 0.7–4.0)
Lymphocytes Relative: 31 % (ref 12–46)
MCH: 29.6 pg (ref 26.0–34.0)
MCHC: 34.4 g/dL (ref 30.0–36.0)
MCV: 86 fL (ref 78.0–100.0)
MONO ABS: 0.5 10*3/uL (ref 0.1–1.0)
MONOS PCT: 7 % (ref 3–12)
MPV: 9.9 fL (ref 8.6–12.4)
NEUTROS ABS: 4.3 10*3/uL (ref 1.7–7.7)
Neutrophils Relative %: 58 % (ref 43–77)
Platelets: 308 10*3/uL (ref 150–400)
RBC: 4.77 MIL/uL (ref 3.87–5.11)
RDW: 13.3 % (ref 11.5–15.5)
WBC: 7.4 10*3/uL (ref 4.0–10.5)

## 2015-05-07 LAB — WET PREP FOR TRICH, YEAST, CLUE
CLUE CELLS WET PREP: NONE SEEN
TRICH WET PREP: NONE SEEN
WBC, Wet Prep HPF POC: NONE SEEN
Yeast Wet Prep HPF POC: NONE SEEN

## 2015-05-07 LAB — GLUCOSE, RANDOM: Glucose, Bld: 80 mg/dL (ref 65–99)

## 2015-05-07 NOTE — Patient Instructions (Signed)
Health Maintenance, Female Adopting a healthy lifestyle and getting preventive care can go a long way to promote health and wellness. Talk with your health care provider about what schedule of regular examinations is right for you. This is a good chance for you to check in with your provider about disease prevention and staying healthy. In between checkups, there are plenty of things you can do on your own. Experts have done a lot of research about which lifestyle changes and preventive measures are most likely to keep you healthy. Ask your health care provider for more information. WEIGHT AND DIET  Eat a healthy diet  Be sure to include plenty of vegetables, fruits, low-fat dairy products, and lean protein.  Do not eat a lot of foods high in solid fats, added sugars, or salt.  Get regular exercise. This is one of the most important things you can do for your health.  Most adults should exercise for at least 150 minutes each week. The exercise should increase your heart rate and make you sweat (moderate-intensity exercise).  Most adults should also do strengthening exercises at least twice a week. This is in addition to the moderate-intensity exercise.  Maintain a healthy weight  Body mass index (BMI) is a measurement that can be used to identify possible weight problems. It estimates body fat based on height and weight. Your health care provider can help determine your BMI and help you achieve or maintain a healthy weight.  For females 20 years of age and older:   A BMI below 18.5 is considered underweight.  A BMI of 18.5 to 24.9 is normal.  A BMI of 25 to 29.9 is considered overweight.  A BMI of 30 and above is considered obese.  Watch levels of cholesterol and blood lipids  You should start having your blood tested for lipids and cholesterol at 47 years of age, then have this test every 5 years.  You may need to have your cholesterol levels checked more often if:  Your lipid  or cholesterol levels are high.  You are older than 47 years of age.  You are at high risk for heart disease.  CANCER SCREENING   Lung Cancer  Lung cancer screening is recommended for adults 55-80 years old who are at high risk for lung cancer because of a history of smoking.  A yearly low-dose CT scan of the lungs is recommended for people who:  Currently smoke.  Have quit within the past 15 years.  Have at least a 30-pack-year history of smoking. A pack year is smoking an average of one pack of cigarettes a day for 1 year.  Yearly screening should continue until it has been 15 years since you quit.  Yearly screening should stop if you develop a health problem that would prevent you from having lung cancer treatment.  Breast Cancer  Practice breast self-awareness. This means understanding how your breasts normally appear and feel.  It also means doing regular breast self-exams. Let your health care provider know about any changes, no matter how small.  If you are in your 20s or 30s, you should have a clinical breast exam (CBE) by a health care provider every 1-3 years as part of a regular health exam.  If you are 40 or older, have a CBE every year. Also consider having a breast X-ray (mammogram) every year.  If you have a family history of breast cancer, talk to your health care provider about genetic screening.  If you   are at high risk for breast cancer, talk to your health care provider about having an MRI and a mammogram every year.  Breast cancer gene (BRCA) assessment is recommended for women who have family members with BRCA-related cancers. BRCA-related cancers include:  Breast.  Ovarian.  Tubal.  Peritoneal cancers.  Results of the assessment will determine the need for genetic counseling and BRCA1 and BRCA2 testing. Cervical Cancer Your health care provider may recommend that you be screened regularly for cancer of the pelvic organs (ovaries, uterus, and  vagina). This screening involves a pelvic examination, including checking for microscopic changes to the surface of your cervix (Pap test). You may be encouraged to have this screening done every 3 years, beginning at age 21.  For women ages 30-65, health care providers may recommend pelvic exams and Pap testing every 3 years, or they may recommend the Pap and pelvic exam, combined with testing for human papilloma virus (HPV), every 5 years. Some types of HPV increase your risk of cervical cancer. Testing for HPV may also be done on women of any age with unclear Pap test results.  Other health care providers may not recommend any screening for nonpregnant women who are considered low risk for pelvic cancer and who do not have symptoms. Ask your health care provider if a screening pelvic exam is right for you.  If you have had past treatment for cervical cancer or a condition that could lead to cancer, you need Pap tests and screening for cancer for at least 20 years after your treatment. If Pap tests have been discontinued, your risk factors (such as having a new sexual partner) need to be reassessed to determine if screening should resume. Some women have medical problems that increase the chance of getting cervical cancer. In these cases, your health care provider may recommend more frequent screening and Pap tests. Colorectal Cancer  This type of cancer can be detected and often prevented.  Routine colorectal cancer screening usually begins at 47 years of age and continues through 47 years of age.  Your health care provider may recommend screening at an earlier age if you have risk factors for colon cancer.  Your health care provider may also recommend using home test kits to check for hidden blood in the stool.  A small camera at the end of a tube can be used to examine your colon directly (sigmoidoscopy or colonoscopy). This is done to check for the earliest forms of colorectal  cancer.  Routine screening usually begins at age 50.  Direct examination of the colon should be repeated every 5-10 years through 47 years of age. However, you may need to be screened more often if early forms of precancerous polyps or small growths are found. Skin Cancer  Check your skin from head to toe regularly.  Tell your health care provider about any new moles or changes in moles, especially if there is a change in a mole's shape or color.  Also tell your health care provider if you have a mole that is larger than the size of a pencil eraser.  Always use sunscreen. Apply sunscreen liberally and repeatedly throughout the day.  Protect yourself by wearing long sleeves, pants, a wide-brimmed hat, and sunglasses whenever you are outside. HEART DISEASE, DIABETES, AND HIGH BLOOD PRESSURE   High blood pressure causes heart disease and increases the risk of stroke. High blood pressure is more likely to develop in:  People who have blood pressure in the high end   of the normal range (130-139/85-89 mm Hg).  People who are overweight or obese.  People who are African American.  If you are 38-23 years of age, have your blood pressure checked every 3-5 years. If you are 61 years of age or older, have your blood pressure checked every year. You should have your blood pressure measured twice--once when you are at a hospital or clinic, and once when you are not at a hospital or clinic. Record the average of the two measurements. To check your blood pressure when you are not at a hospital or clinic, you can use:  An automated blood pressure machine at a pharmacy.  A home blood pressure monitor.  If you are between 45 years and 39 years old, ask your health care provider if you should take aspirin to prevent strokes.  Have regular diabetes screenings. This involves taking a blood sample to check your fasting blood sugar level.  If you are at a normal weight and have a low risk for diabetes,  have this test once every three years after 47 years of age.  If you are overweight and have a high risk for diabetes, consider being tested at a younger age or more often. PREVENTING INFECTION  Hepatitis B  If you have a higher risk for hepatitis B, you should be screened for this virus. You are considered at high risk for hepatitis B if:  You were born in a country where hepatitis B is common. Ask your health care provider which countries are considered high risk.  Your parents were born in a high-risk country, and you have not been immunized against hepatitis B (hepatitis B vaccine).  You have HIV or AIDS.  You use needles to inject street drugs.  You live with someone who has hepatitis B.  You have had sex with someone who has hepatitis B.  You get hemodialysis treatment.  You take certain medicines for conditions, including cancer, organ transplantation, and autoimmune conditions. Hepatitis C  Blood testing is recommended for:  Everyone born from [birth year] through 1963.  Anyone with known risk factors for hepatitis C. Sexually transmitted infections (STIs)  You should be screened for sexually transmitted infections (STIs) including gonorrhea and chlamydia if:  You are sexually active and are younger than 47 years of age.  You are older than 47 years of age and your health care provider tells you that you are at risk for this type of infection.  Your sexual activity has changed since you were last screened and you are at an increased risk for chlamydia or gonorrhea. Ask your health care provider if you are at risk.  If you do not have HIV, but are at risk, it may be recommended that you take a prescription medicine daily to prevent HIV infection. This is called pre-exposure prophylaxis (PrEP). You are considered at risk if:  You are sexually active and do not regularly use condoms or know the HIV status of your partner(s).  You take drugs by injection.  You are sexually  active with a partner who has HIV. Talk with your health care provider about whether you are at high risk of being infected with HIV. If you choose to begin PrEP, you should first be tested for HIV. You should then be tested every 3 months for as long as you are taking PrEP.  PREGNANCY   If you are premenopausal and you may become pregnant, ask your health care provider about preconception counseling.  If you may  become pregnant, take 400 to 800 micrograms (mcg) of folic acid every day.  If you want to prevent pregnancy, talk to your health care provider about birth control (contraception). OSTEOPOROSIS AND MENOPAUSE   Osteoporosis is a disease in which the bones lose minerals and strength with aging. This can result in serious bone fractures. Your risk for osteoporosis can be identified using a bone density scan.  If you are 61 years of age or older, or if you are at risk for osteoporosis and fractures, ask your health care provider if you should be screened.  Ask your health care provider whether you should take a calcium or vitamin D supplement to lower your risk for osteoporosis.  Menopause may have certain physical symptoms and risks.  Hormone replacement therapy may reduce some of these symptoms and risks. Talk to your health care provider about whether hormone replacement therapy is right for you.  HOME CARE INSTRUCTIONS   Schedule regular health, dental, and eye exams.  Stay current with your immunizations.   Do not use any tobacco products including cigarettes, chewing tobacco, or electronic cigarettes.  If you are pregnant, do not drink alcohol.  If you are breastfeeding, limit how much and how often you drink alcohol.  Limit alcohol intake to no more than 1 drink per day for nonpregnant women. One drink equals 12 ounces of beer, 5 ounces of wine, or 1 ounces of hard liquor.  Do not use street drugs.  Do not share needles.  Ask your health care provider for help if  you need support or information about quitting drugs.  Tell your health care provider if you often feel depressed.  Tell your health care provider if you have ever been abused or do not feel safe at home.   This information is not intended to replace advice given to you by your health care provider. Make sure you discuss any questions you have with your health care provider.   Document Released: 01/25/2011 Document Revised: 08/02/2014 Document Reviewed: 06/13/2013 Elsevier Interactive Patient Education [employer].

## 2015-05-07 NOTE — Progress Notes (Signed)
Patient ID: Erika Tran, female   DOB: 01/30/1966, 49 y.o.   MRN: [id number] Erika Tran 06/10/1966 [id number]    History:    Presents for annual exam. Amenorrhea on Mirena IUD placed 03/2012, mild discharge.  Melasma on cheeks and forehead, using a cream from the dermatologist with some improvement, considering skin bleaching.  History of LGSIL 2001 with normal Paps after, 2015. Gestational diabetes with one of four pregnancies.  Has not had a mammogram.   Past medical history, past surgical history, family history and social history were all reviewed and documented in the EPIC chart.  4 children son graduated from [employer], one at [employer], one in high school, one in middle school. 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 Father hypertension. Paternal grandmother breast cancer. Excellent lipid panel 2013.  ROS:  A ROS was performed and pertinent positives and negatives are included.  Exam:  Filed Vitals:   05/07/15 0816  BP: 110/76    General appearance:  Normal Thyroid:  Symmetrical, normal in size, without palpable masses or nodularity. Respiratory  Auscultation:  Clear without wheezing or rhonchi Cardiovascular  Auscultation:  Regular rate, without rubs, murmurs or gallops  Edema/varicosities:  Not grossly evident Abdominal  Soft,nontender, without masses, guarding or rebound.  Liver/spleen:  No organomegaly noted  Hernia:  small umbilical hernia nontender  Skin  Inspection:  Grossly normal   Breasts: Examined lying and sitting.     Right: Without masses, retractions, discharge or axillary adenopathy.     Left: Without masses, retractions, discharge or axillary adenopathy. Gentitourinary   Inguinal/mons:  Normal without inguinal adenopathy  External genitalia:  Normal  BUS/Urethra/Skene's glands:  Normal  Vagina:  Normal  Cervix:  Normal, strings not seen.  Uterus:  Normal in size, shape and contour.  Midline and mobile  Adnexa/parametria:     Rt: Without masses or  tenderness.   Lt: Without masses or tenderness.  Anus and perineum: Normal  Digital rectal exam: Normal sphincter tone without palpated masses or tenderness  Assessment/Plan:  49 y.o.  G4P4 MHF for annual exam with complaint of vaginal discharge and weight gain.   Amenorrhea with Mirena 03/2012  Wet Prep- Negative Melasma Small asymptomatic umbilical hernia  Plan: Having skin changes/melasma, continue dermatologist recommendations.  Mirena effective until 2018.  SBE's, reviewed importance of annual screening mammogram and instructed to schedule ASAP, breast center information.  Low carb diet and packed lunches for weight loss. Continue active lifestyle, calcium rich diet, vitamin D 1000 daily encouraged. CBC, glucose,lipids, and UA. Pap normal 2015, new screening guidelines reviewed. Reviewed normality of wet prep and exam.    Harrington ChallengerYOUNG,NANCY J [facility]NP, 9:24 AM 05/07/2015

## 2016-05-07 ENCOUNTER — Encounter: Payer: Managed Care, Other (non HMO) | Admitting: Women's Health

## 2017-05-17 ENCOUNTER — Other Ambulatory Visit: Payer: Self-pay | Admitting: Women's Health

## 2017-05-17 ENCOUNTER — Ambulatory Visit (INDEPENDENT_AMBULATORY_CARE_PROVIDER_SITE_OTHER): Payer: Managed Care, Other (non HMO) | Admitting: Women's Health

## 2017-05-17 ENCOUNTER — Encounter: Payer: Self-pay | Admitting: Women's Health

## 2017-05-17 VITALS — BP 118/80 | Ht 66.0 in | Wt 166.0 lb

## 2017-05-17 DIAGNOSIS — Z1231 Encounter for screening mammogram for malignant neoplasm of breast: Secondary | ICD-10-CM

## 2017-05-17 DIAGNOSIS — Z13220 Encounter for screening for lipoid disorders: Secondary | ICD-10-CM | POA: Diagnosis not present

## 2017-05-17 DIAGNOSIS — Z01419 Encounter for gynecological examination (general) (routine) without abnormal findings: Secondary | ICD-10-CM

## 2017-05-17 NOTE — Patient Instructions (Signed)

## 2017-05-17 NOTE — Progress Notes (Signed)
Erika Tran 08/25/1965 [id number]    History: 51 year old MWF G4P4 presents for annual exam without complaints. Mirena 03/2012, amenorrheic. No vasomotor symptoms. History of LGSIL 2001, negative paps afterwards, 2015. Melasma over face. Small umbilical hernia, asymptomatic. History of gestational diabetes with 1 of 4 pregnancies. Lipid panel 2016 excellent. Never received a mammogram.  Past medical history, past surgical history, family history and social history were all reviewed and documented in the EPIC chart. Cleans homes. 4 children, one graduated from [employer] and another from [employer]. 2 others in high school with one about to graduate. Mother osteoporosis, father hypertension, paternal grandmother breast cancer.   ROS:  A ROS was performed and pertinent positives and negatives are included.  Exam:  Vitals:   05/17/17 0801  BP: 118/80  Weight: 166 lb (75.3 kg)  Height: 5\' 6"  (1.676 m)   Body mass index is 26.79 kg/m.   General appearance:  Normal Thyroid:  Symmetrical, normal in size, without palpable masses or nodularity. Respiratory  Auscultation:  Clear without wheezing or rhonchi Cardiovascular  Auscultation:  Regular rate, without rubs, murmurs or gallops  Edema/varicosities:  Not grossly evident Abdominal  Soft,nontender, without masses, guarding or rebound.  Liver/spleen:  No organomegaly noted  Hernia:  Small umbilical hernia, nontender.  Skin  Inspection:  Grossly normal. Melasma on face   Breasts: Examined lying and sitting.     Right: Without masses, retractions, discharge or axillary adenopathy.     Left: Without masses, retractions, discharge or axillary adenopathy. Gentitourinary   Inguinal/mons:  Normal without inguinal adenopathy  External genitalia:  Normal  BUS/Urethra/Skene's glands:  Normal  Vagina:  Normal  Cervix:  Normal IUD strings visible  Uterus: normal in size, shape and contour.  Midline and mobile  Adnexa/parametria:     Rt: Without  masses or tenderness.   Lt: Without masses or tenderness.  Anus and perineum: Normal  Digital rectal exam: Normal sphincter tone without palpated masses or tenderness  Assessment/Plan:  51 y.o. MWF G4P4 presents for annual exam without complaints.  Amenorrhea with Mirena 03/2012 - due for removal and replacement Melasma Small asymptomatic umbilical hernia 2001 LGSIL-normal Paps after  Plan: Conception options reviewed, Schedule for appointment to remove IUD and replace  another Mirena with Dr. Audie BoxFontaine. Strongly encouraged annual bilateral screening mammogram, instructed to schedule ASAP. Counseled on SBEs, decreasing soda/sugar intake, (10 pound wt gain) healthy diet/exercise, calcium rich diet, Vitamin D 1000U daily, fall prevention, personal safety, sun protection for skin/melasma, colonoscopy and Shingrix at age 51. UA with culture, CBC, CMP, lipid panel. Pap with HR HPV typing, normal in 2015, new screening guidelines reviewed.  Harrington Challengerancy J Kimi Bordeau [facility]WHNP, 8:29 AM 05/17/2017

## 2017-05-18 LAB — URINALYSIS W MICROSCOPIC + REFLEX CULTURE
Bilirubin Urine: NEGATIVE
Glucose, UA: NEGATIVE
Hyaline Cast: NONE SEEN /LPF
Ketones, ur: NEGATIVE
Leukocyte Esterase: NEGATIVE
Nitrites, Initial: NEGATIVE
Protein, ur: NEGATIVE
SPECIFIC GRAVITY, URINE: 1.022 (ref 1.001–1.03)
pH: <=5 (ref 5.0–8.0)

## 2017-05-18 LAB — NO CULTURE INDICATED

## 2017-05-20 LAB — PAP, TP IMAGING W/ HPV RNA, RFLX HPV TYPE 16,18/45: HPV DNA High Risk: NOT DETECTED

## 2017-05-25 ENCOUNTER — Ambulatory Visit: Payer: Managed Care, Other (non HMO) | Admitting: Gynecology

## 2017-06-02 ENCOUNTER — Inpatient Hospital Stay: Admission: RE | Admit: 2017-06-02 | Payer: Managed Care, Other (non HMO) | Source: Ambulatory Visit

## 2017-06-06 ENCOUNTER — Ambulatory Visit: Payer: Managed Care, Other (non HMO) | Admitting: Gynecology

## 2017-06-27 ENCOUNTER — Ambulatory Visit: Payer: Managed Care, Other (non HMO) | Admitting: Gynecology

## 2017-08-29 ENCOUNTER — Encounter: Payer: Self-pay | Admitting: Gynecology

## 2017-08-29 ENCOUNTER — Ambulatory Visit (INDEPENDENT_AMBULATORY_CARE_PROVIDER_SITE_OTHER): Payer: Managed Care, Other (non HMO) | Admitting: Gynecology

## 2017-08-29 VITALS — BP 116/74

## 2017-08-29 DIAGNOSIS — Z30433 Encounter for removal and reinsertion of intrauterine contraceptive device: Secondary | ICD-10-CM | POA: Diagnosis not present

## 2017-08-29 HISTORY — PX: INTRAUTERINE DEVICE INSERTION: SHX323

## 2017-08-29 NOTE — Patient Instructions (Signed)

## 2017-08-29 NOTE — Progress Notes (Signed)
    Erika Tran 09/14/1965 [id number]        52 y.o.  [id number]  presents for Mirena IUD removal and replacement. She has read through the booklet, has no contraindications and signed the consent form.  She was due to have this replaced at 7 years in September.  I reviewed that she is technically late but that studies have certainly shown contraceptive efficacy extending past the 5-year mark and she is comfortable with replacement.  I reviewed the removal and insertional process with her as well as the risks to include infection, either immediate or long-term, uterine perforation or migration requiring surgery to remove, other complications such as pain, hormonal side effects and possibility of failure with subsequent pregnancy.   Exam with Erika Tran assistant Vitals:   08/29/17 0804  BP: 116/74    Pelvic: External BUS vagina normal. Cervix normal normal with IUD string visualized. Uterus anteverted normal size shape contour midline mobile nontender. Adnexa without masses or tenderness.  Procedure: The cervix was visualized with a speculum, the IUD string grasped with a [place] forcep and the old Mirena IUD was removed, shown to the patient and discarded.  The cervix was then cleansed with Betadine, anterior lip grasped with a single-tooth tenaculum, the uterus was sounded and a Mirena IUD was placed according to manufacturer's recommendations without difficulty. The strings were trimmed. The patient tolerated well and will follow up in one month for a postinsertional check.  Lot number:  [id number]    Dara Lordsimothy P Keanu Frickey MD, 8:22 AM 08/29/2017

## 2017-08-30 ENCOUNTER — Encounter: Payer: Self-pay | Admitting: Gynecology

## 2017-10-04 ENCOUNTER — Encounter: Payer: Self-pay | Admitting: Gynecology

## 2017-10-04 ENCOUNTER — Ambulatory Visit (INDEPENDENT_AMBULATORY_CARE_PROVIDER_SITE_OTHER): Payer: Managed Care, Other (non HMO) | Admitting: Gynecology

## 2017-10-04 VITALS — BP 120/74

## 2017-10-04 DIAGNOSIS — Z30431 Encounter for routine checking of intrauterine contraceptive device: Secondary | ICD-10-CM | POA: Diagnosis not present

## 2017-10-04 NOTE — Progress Notes (Signed)
    Erika Tran 12/24/1965 [id number]        52 y.o.  [id number] presents for IUD follow-up exam.  She had her Mirena IUD replaced 08/29/2017.  Has done well without reported issues.  Past medical history,surgical history, problem list, medications, allergies, family history and social history were all reviewed and documented in the EPIC chart.  Directed ROS with pertinent positives and negatives documented in the history of present illness/assessment and plan.  Exam: Kennon PortelaKim Tran assistant Vitals:   10/04/17 0802  BP: 120/74   General appearance:  Normal Abdomen soft nontender without masses guarding rebound Pelvic external BUS vagina normal.  Cervix normal.  IUD string visualized and appropriate length.  Uterus normal size midline mobile nontender.  Adnexa without masses or tenderness.  Assessment/Plan:  52 y.o. [id number] with normal follow-up IUD check.  Assuming patient continues well she will follow-up in October/November when due for annual exam.  Sooner if any issues.    Dara Lordsimothy P Alahia Whicker MD, 8:11 AM 10/04/2017

## 2017-10-04 NOTE — Patient Instructions (Signed)
Follow-up in October or November when due for annual exam.  Sooner if any issues.

## 2018-05-23 ENCOUNTER — Encounter: Payer: Managed Care, Other (non HMO) | Admitting: Women's Health

## 2018-06-20 ENCOUNTER — Other Ambulatory Visit: Payer: Self-pay | Admitting: Women's Health

## 2018-06-20 DIAGNOSIS — Z1231 Encounter for screening mammogram for malignant neoplasm of breast: Secondary | ICD-10-CM

## 2018-06-28 ENCOUNTER — Encounter: Payer: Managed Care, Other (non HMO) | Admitting: Women's Health

## 2018-08-02 ENCOUNTER — Ambulatory Visit
Admission: RE | Admit: 2018-08-02 | Discharge: 2018-08-02 | Disposition: A | Discharge status: Home / Self Care | Payer: Managed Care, Other (non HMO) | Source: Ambulatory Visit | Attending: Women's Health | Admitting: Women's Health

## 2018-08-02 DIAGNOSIS — Z1231 Encounter for screening mammogram for malignant neoplasm of breast: Secondary | ICD-10-CM

## 2018-08-03 ENCOUNTER — Other Ambulatory Visit: Payer: Self-pay | Admitting: Women's Health

## 2018-08-03 DIAGNOSIS — R928 Other abnormal and inconclusive findings on diagnostic imaging of breast: Secondary | ICD-10-CM

## 2018-08-14 ENCOUNTER — Ambulatory Visit
Admission: RE | Admit: 2018-08-14 | Discharge: 2018-08-14 | Disposition: A | Discharge status: Home / Self Care | Payer: Managed Care, Other (non HMO) | Source: Ambulatory Visit | Attending: Women's Health | Admitting: Women's Health

## 2018-08-14 DIAGNOSIS — R928 Other abnormal and inconclusive findings on diagnostic imaging of breast: Secondary | ICD-10-CM

## 2018-09-20 ENCOUNTER — Ambulatory Visit (INDEPENDENT_AMBULATORY_CARE_PROVIDER_SITE_OTHER): Payer: Managed Care, Other (non HMO) | Admitting: Women's Health

## 2018-09-20 ENCOUNTER — Encounter: Payer: Self-pay | Admitting: Women's Health

## 2018-09-20 VITALS — BP 120/80 | Ht 66.0 in | Wt 171.0 lb

## 2018-09-20 DIAGNOSIS — R635 Abnormal weight gain: Secondary | ICD-10-CM | POA: Diagnosis not present

## 2018-09-20 DIAGNOSIS — Z01419 Encounter for gynecological examination (general) (routine) without abnormal findings: Secondary | ICD-10-CM | POA: Diagnosis not present

## 2018-09-20 DIAGNOSIS — Z13220 Encounter for screening for lipoid disorders: Secondary | ICD-10-CM

## 2018-09-20 NOTE — Progress Notes (Signed)
Erika Tran August 21, 1965 [id number]    History:    Presents for annual exam.  08/2017 Mirena IUD amenorrheic.  Occasional hot flushes.  2001 LGSIL with normal Paps after.  Normal mammogram history,  2020 normal mammogram after ultrasound.  Has not had a screening colonoscopy.  Has gained 5 pounds in the past year despite efforts to maintain or lose weight.  Past medical history, past surgical history, family history and social history were all reviewed and documented in the EPIC chart.  Cleans houses.  4 children 2 of graduated from college one in college and one is senior in high school planning to attend [employer].  Father hypertension.  Paternal grandmother breast cancer.  GDM with second pregnancy only.  [place].  ROS:  A ROS was performed and pertinent positives and negatives are included.  Exam:  Vitals:   09/20/18 0857  BP: 120/80  Weight: 171 lb (77.6 kg)  Height: 5\' 6"  (1.676 m)   Body mass index is 27.6 kg/m.   General appearance:  Normal Thyroid:  Symmetrical, normal in size, without palpable masses or nodularity. Respiratory  Auscultation:  Clear without wheezing or rhonchi Cardiovascular  Auscultation:  Regular rate, without rubs, murmurs or gallops  Edema/varicosities:  Not grossly evident Abdominal  Soft,nontender, without masses, guarding or rebound.  Liver/spleen:  No organomegaly noted  Hernia: Small  umbilical hernia nontender  Skin  Inspection:  Grossly normal - melasma   Breasts: Examined lying and sitting.     Right: Without masses, retractions, discharge or axillary adenopathy.     Left: Without masses, retractions, discharge or axillary adenopathy. Gentitourinary   Inguinal/mons:  Normal without inguinal adenopathy  External genitalia:  Normal  BUS/Urethra/Skene's glands:  Normal  Vagina:  Normal  Cervix:  Normal IUD strings visible  Uterus:  normal in size, shape and contour.  Midline and mobile  Adnexa/parametria:     Rt: Without  masses or tenderness.   Lt: Without masses or tenderness.  Anus and perineum: Normal  Digital rectal exam: Normal sphincter tone without palpated masses or tenderness  Assessment/Plan:  53 y.o. M LF G4, P4 for annual exam with no complaints.  08/2017 Mirena IUD amenorrhea Weight gain despite healthy lifestyle Small umbilical hernia /no change /no pain  Plan: Screening colonoscopy reviewed, [facility] information given instructed to schedule.  SBEs, continue annual 3D screening mammogram, calcium rich foods, vitamin D 2000 IUs daily encouraged.  Continue regular cardio type exercise, decreasing calorie/carbs encouraged.  CBC, CMP, lipid panel, TSH will come back fasting for labs.  Pap normal 2018, new screening guidelines reviewed.   Harrington Challenger [facility], 9:37 AM 09/20/2018

## 2018-09-20 NOTE — Patient Instructions (Signed)
Colonoscopy  [facility]  Dr Carlean Purl  [place] Maintenance, Female Adopting a healthy lifestyle and getting preventive care can go a long way to promote health and wellness. Talk with your health care provider about what schedule of regular examinations is right for you. This is a good chance for you to check in with your provider about disease prevention and staying healthy. In between checkups, there are plenty of things you can do on your own. Experts have done a lot of research about which lifestyle changes and preventive measures are most likely to keep you healthy. Ask your health care provider for more information. Weight and diet Eat a healthy diet  Be sure to include plenty of vegetables, fruits, low-fat dairy products, and lean protein.  Do not eat a lot of foods high in solid fats, added sugars, or salt.  Get regular exercise. This is one of the most important things you can do for your health. ? Most adults should exercise for at least 150 minutes each week. The exercise should increase your heart rate and make you sweat (moderate-intensity exercise). ? Most adults should also do strengthening exercises at least twice a week. This is in addition to the moderate-intensity exercise. Maintain a healthy weight  Body mass index (BMI) is a measurement that can be used to identify possible weight problems. It estimates body fat based on height and weight. Your health care provider can help determine your BMI and help you achieve or maintain a healthy weight.  For females 4 years of age and older: ? A BMI below 18.5 is considered underweight. ? A BMI of 18.5 to 24.9 is normal. ? A BMI of 25 to 29.9 is considered overweight. ? A BMI of 30 and above is considered obese. Watch levels of cholesterol and blood lipids  You should start having your blood tested for lipids and cholesterol at 51 years of age, then have this test every 5 years.  You may need to have your cholesterol  levels checked more often if: ? Your lipid or cholesterol levels are high. ? You are older than 51 years of age. ? You are at high risk for heart disease. Cancer screening Lung Cancer  Lung cancer screening is recommended for adults 94-56 years old who are at high risk for lung cancer because of a history of smoking.  A yearly low-dose CT scan of the lungs is recommended for people who: ? Currently smoke. ? Have quit within the past 15 years. ? Have at least a 30-pack-year history of smoking. A pack year is smoking an average of one pack of cigarettes a day for 1 year.  Yearly screening should continue until it has been 15 years since you quit.  Yearly screening should stop if you develop a health problem that would prevent you from having lung cancer treatment. Breast Cancer  Practice breast self-awareness. This means understanding how your breasts normally appear and feel.  It also means doing regular breast self-exams. Let your health care provider know about any changes, no matter how small.  If you are in your 20s or 30s, you should have a clinical breast exam (CBE) by a health care provider every 1-3 years as part of a regular health exam.  If you are 16 or older, have a CBE every year. Also consider having a breast X-ray (mammogram) every year.  If you have a family history of breast cancer, talk to your health care provider about genetic screening.  If you are at high risk for breast cancer, talk to your health care provider about having an MRI and a mammogram every year.  Breast cancer gene (BRCA) assessment is recommended for women who have family members with BRCA-related cancers. BRCA-related cancers include: ? Breast. ? Ovarian. ? Tubal. ? Peritoneal cancers.  Results of the assessment will determine the need for genetic counseling and BRCA1 and BRCA2 testing. Cervical Cancer Your health care provider may recommend that you be screened regularly for cancer of the  pelvic organs (ovaries, uterus, and vagina). This screening involves a pelvic examination, including checking for microscopic changes to the surface of your cervix (Pap test). You may be encouraged to have this screening done every 3 years, beginning at age 29.  For women ages 73-65, health care providers may recommend pelvic exams and Pap testing every 3 years, or they may recommend the Pap and pelvic exam, combined with testing for human papilloma virus (HPV), every 5 years. Some types of HPV increase your risk of cervical cancer. Testing for HPV may also be done on women of any age with unclear Pap test results.  Other health care providers may not recommend any screening for nonpregnant women who are considered low risk for pelvic cancer and who do not have symptoms. Ask your health care provider if a screening pelvic exam is right for you.  If you have had past treatment for cervical cancer or a condition that could lead to cancer, you need Pap tests and screening for cancer for at least 20 years after your treatment. If Pap tests have been discontinued, your risk factors (such as having a new sexual partner) need to be reassessed to determine if screening should resume. Some women have medical problems that increase the chance of getting cervical cancer. In these cases, your health care provider may recommend more frequent screening and Pap tests. Colorectal Cancer  This type of cancer can be detected and often prevented.  Routine colorectal cancer screening usually begins at 51 years of age and continues through 51 years of age.  Your health care provider may recommend screening at an earlier age if you have risk factors for colon cancer.  Your health care provider may also recommend using home test kits to check for hidden blood in the stool.  A small camera at the end of a tube can be used to examine your colon directly (sigmoidoscopy or colonoscopy). This is done to check for the earliest  forms of colorectal cancer.  Routine screening usually begins at age 32.  Direct examination of the colon should be repeated every 5-10 years through 51 years of age. However, you may need to be screened more often if early forms of precancerous polyps or small growths are found. Skin Cancer  Check your skin from head to toe regularly.  Tell your health care provider about any new moles or changes in moles, especially if there is a change in a mole's shape or color.  Also tell your health care provider if you have a mole that is larger than the size of a pencil eraser.  Always use sunscreen. Apply sunscreen liberally and repeatedly throughout the day.  Protect yourself by wearing long sleeves, pants, a wide-brimmed hat, and sunglasses whenever you are outside. Heart disease, diabetes, and high blood pressure  High blood pressure causes heart disease and increases the risk of stroke. High blood pressure is more likely to develop in: ? People who have blood pressure in the high  end of the normal range (130-139/85-89 mm Hg). ? People who are overweight or obese. ? People who are African American.  If you are 80-42 years of age, have your blood pressure checked every 3-5 years. If you are 47 years of age or older, have your blood pressure checked every year. You should have your blood pressure measured twice-once when you are at a hospital or clinic, and once when you are not at a hospital or clinic. Record the average of the two measurements. To check your blood pressure when you are not at a hospital or clinic, you can use: ? An automated blood pressure machine at a pharmacy. ? A home blood pressure monitor.  If you are between 49 years and 75 years old, ask your health care provider if you should take aspirin to prevent strokes.  Have regular diabetes screenings. This involves taking a blood sample to check your fasting blood sugar level. ? If you are at a normal weight and have a low  risk for diabetes, have this test once every three years after 51 years of age. ? If you are overweight and have a high risk for diabetes, consider being tested at a younger age or more often. Preventing infection Hepatitis B  If you have a higher risk for hepatitis B, you should be screened for this virus. You are considered at high risk for hepatitis B if: ? You were born in a country where hepatitis B is common. Ask your health care provider which countries are considered high risk. ? Your parents were born in a high-risk country, and you have not been immunized against hepatitis B (hepatitis B vaccine). ? You have HIV or AIDS. ? You use needles to inject street drugs. ? You live with someone who has hepatitis B. ? You have had sex with someone who has hepatitis B. ? You get hemodialysis treatment. ? You take certain medicines for conditions, including cancer, organ transplantation, and autoimmune conditions. Hepatitis C  Blood testing is recommended for: ? Everyone born from [birth year] through 1963. ? Anyone with known risk factors for hepatitis C. Sexually transmitted infections (STIs)  You should be screened for sexually transmitted infections (STIs) including gonorrhea and chlamydia if: ? You are sexually active and are younger than 51 years of age. ? You are older than 51 years of age and your health care provider tells you that you are at risk for this type of infection. ? Your sexual activity has changed since you were last screened and you are at an increased risk for chlamydia or gonorrhea. Ask your health care provider if you are at risk.  If you do not have HIV, but are at risk, it may be recommended that you take a prescription medicine daily to prevent HIV infection. This is called pre-exposure prophylaxis (PrEP). You are considered at risk if: ? You are sexually active and do not regularly use condoms or know the HIV status of your partner(s). ? You take drugs by  injection. ? You are sexually active with a partner who has HIV. Talk with your health care provider about whether you are at high risk of being infected with HIV. If you choose to begin PrEP, you should first be tested for HIV. You should then be tested every 3 months for as long as you are taking PrEP. Pregnancy  If you are premenopausal and you may become pregnant, ask your health care provider about preconception counseling.  If you may become pregnant,  take 400 to 800 micrograms (mcg) of folic acid every day.  If you want to prevent pregnancy, talk to your health care provider about birth control (contraception). Osteoporosis and menopause  Osteoporosis is a disease in which the bones lose minerals and strength with aging. This can result in serious bone fractures. Your risk for osteoporosis can be identified using a bone density scan.  If you are 26 years of age or older, or if you are at risk for osteoporosis and fractures, ask your health care provider if you should be screened.  Ask your health care provider whether you should take a calcium or vitamin D supplement to lower your risk for osteoporosis.  Menopause may have certain physical symptoms and risks.  Hormone replacement therapy may reduce some of these symptoms and risks. Talk to your health care provider about whether hormone replacement therapy is right for you. Follow these instructions at home:  Schedule regular health, dental, and eye exams.  Stay current with your immunizations.  Do not use any tobacco products including cigarettes, chewing tobacco, or electronic cigarettes.  If you are pregnant, do not drink alcohol.  If you are breastfeeding, limit how much and how often you drink alcohol.  Limit alcohol intake to no more than 1 drink per day for nonpregnant women. One drink equals 12 ounces of beer, 5 ounces of wine, or 1 ounces of hard liquor.  Do not use street drugs.  Do not share needles.  Ask  your health care provider for help if you need support or information about quitting drugs.  Tell your health care provider if you often feel depressed.  Tell your health care provider if you have ever been abused or do not feel safe at home. This information is not intended to replace advice given to you by your health care provider. Make sure you discuss any questions you have with your health care provider. Document Released: 01/25/2011 Document Revised: 12/18/2015 Document Reviewed: 04/15/2015 Elsevier Interactive Patient Education  2019 [employer].

## 2018-09-22 LAB — URINE CULTURE
MICRO NUMBER: 250580
Result:: NO GROWTH
SPECIMEN QUALITY:: ADEQUATE

## 2018-09-22 LAB — URINALYSIS, COMPLETE W/RFL CULTURE
BILIRUBIN URINE: NEGATIVE
Glucose, UA: NEGATIVE
Hyaline Cast: NONE SEEN /LPF
Ketones, ur: NEGATIVE
Leukocyte Esterase: NEGATIVE
Nitrites, Initial: NEGATIVE
Protein, ur: NEGATIVE
Specific Gravity, Urine: 1.025 (ref 1.001–1.03)
WBC, UA: NONE SEEN /HPF (ref 0–5)
pH: 6 (ref 5.0–8.0)

## 2018-09-22 LAB — CULTURE INDICATED

## 2018-09-26 ENCOUNTER — Other Ambulatory Visit: Payer: Managed Care, Other (non HMO)

## 2019-03-21 ENCOUNTER — Other Ambulatory Visit: Payer: Self-pay

## 2019-03-21 ENCOUNTER — Other Ambulatory Visit: Payer: Managed Care, Other (non HMO)

## 2019-03-21 DIAGNOSIS — Z13220 Encounter for screening for lipoid disorders: Secondary | ICD-10-CM

## 2019-03-21 DIAGNOSIS — Z01419 Encounter for gynecological examination (general) (routine) without abnormal findings: Secondary | ICD-10-CM

## 2019-03-22 ENCOUNTER — Telehealth: Payer: Self-pay

## 2019-03-22 NOTE — Telephone Encounter (Signed)
-----   Message from Huel Cote, NP sent at 03/22/2019 12:18 PM EDT ----- Please call and review labs all normal which is great.

## 2019-03-22 NOTE — Telephone Encounter (Signed)
Patient was informed of normal results. She asked about TSH but was not done. She said she discussed in room with you and thought you were ordering that.  Do you want me to see if it can be added?

## 2019-03-22 NOTE — Telephone Encounter (Signed)
Yes, that would be great if they can do, thank you

## 2019-03-23 LAB — COMPREHENSIVE METABOLIC PANEL
AG Ratio: 1.7 (calc) (ref 1.0–2.5)
ALT: 14 U/L (ref 6–29)
AST: 15 U/L (ref 10–35)
Albumin: 4.1 g/dL (ref 3.6–5.1)
Alkaline phosphatase (APISO): 61 U/L (ref 37–153)
BUN: 16 mg/dL (ref 7–25)
CO2: 26 mmol/L (ref 20–32)
Calcium: 9.2 mg/dL (ref 8.6–10.4)
Chloride: 105 mmol/L (ref 98–110)
Creat: 0.67 mg/dL (ref 0.50–1.05)
Globulin: 2.4 g/dL (calc) (ref 1.9–3.7)
Glucose, Bld: 79 mg/dL (ref 65–99)
Potassium: 4 mmol/L (ref 3.5–5.3)
Sodium: 141 mmol/L (ref 135–146)
Total Bilirubin: 0.8 mg/dL (ref 0.2–1.2)
Total Protein: 6.5 g/dL (ref 6.1–8.1)

## 2019-03-23 LAB — CBC WITH DIFFERENTIAL/PLATELET
Absolute Monocytes: 399 cells/uL (ref 200–950)
Basophils Absolute: 17 cells/uL (ref 0–200)
Basophils Relative: 0.3 %
Eosinophils Absolute: 137 cells/uL (ref 15–500)
Eosinophils Relative: 2.4 %
HCT: 39.8 % (ref 35.0–45.0)
Hemoglobin: 13.4 g/dL (ref 11.7–15.5)
Lymphs Abs: 2189 cells/uL (ref 850–3900)
MCH: 29.5 pg (ref 27.0–33.0)
MCHC: 33.7 g/dL (ref 32.0–36.0)
MCV: 87.7 fL (ref 80.0–100.0)
MPV: 10.7 fL (ref 7.5–12.5)
Monocytes Relative: 7 %
Neutro Abs: 2958 cells/uL (ref 1500–7800)
Neutrophils Relative %: 51.9 %
Platelets: 301 10*3/uL (ref 140–400)
RBC: 4.54 10*6/uL (ref 3.80–5.10)
RDW: 12 % (ref 11.0–15.0)
Total Lymphocyte: 38.4 %
WBC: 5.7 10*3/uL (ref 3.8–10.8)

## 2019-03-23 LAB — LIPID PANEL
Cholesterol: 128 mg/dL (ref <200)
HDL: 47 mg/dL — ABNORMAL LOW (ref >=50)
LDL Cholesterol (Calc): 67 mg/dL (calc)
Non-HDL Cholesterol (Calc): 81 mg/dL (calc) (ref <130)
Total CHOL/HDL Ratio: 2.7 (calc) (ref <5.0)
Triglycerides: 64 mg/dL (ref <150)

## 2019-03-23 LAB — TEST AUTHORIZATION

## 2019-03-23 LAB — TSH: TSH: 2.57 mIU/L

## 2019-03-23 NOTE — Telephone Encounter (Signed)
Request provided to [facility] for TSH to be added.

## 2019-04-18 ENCOUNTER — Encounter: Payer: Self-pay | Admitting: Gynecology

## 2019-09-24 ENCOUNTER — Other Ambulatory Visit: Payer: Self-pay

## 2019-09-25 ENCOUNTER — Ambulatory Visit (INDEPENDENT_AMBULATORY_CARE_PROVIDER_SITE_OTHER): Payer: Managed Care, Other (non HMO) | Admitting: Women's Health

## 2019-09-25 ENCOUNTER — Telehealth: Payer: Self-pay | Admitting: *Deleted

## 2019-09-25 ENCOUNTER — Encounter: Payer: Self-pay | Admitting: Women's Health

## 2019-09-25 VITALS — BP 120/82 | Ht 66.0 in | Wt 179.0 lb

## 2019-09-25 DIAGNOSIS — Z01419 Encounter for gynecological examination (general) (routine) without abnormal findings: Secondary | ICD-10-CM

## 2019-09-25 DIAGNOSIS — R350 Frequency of micturition: Secondary | ICD-10-CM

## 2019-09-25 NOTE — Patient Instructions (Signed)
It was good seeing you today Vitamin D 2000 IUs daily Screening colonoscopy [facility] [phone] Dr. Leone Payor Health Maintenance, Female Adopting a healthy lifestyle and getting preventive care are important in promoting health and wellness. Ask your health care provider about:  The right schedule for you to have regular tests and exams.  Things you can do on your own to prevent diseases and keep yourself healthy. What should I know about diet, weight, and exercise? Eat a healthy diet   Eat a diet that includes plenty of vegetables, fruits, low-fat dairy products, and lean protein.  Do not eat a lot of foods that are high in solid fats, added sugars, or sodium. Maintain a healthy weight Body mass index (BMI) is used to identify weight problems. It estimates body fat based on height and weight. Your health care provider can help determine your BMI and help you achieve or maintain a healthy weight. Get regular exercise Get regular exercise. This is one of the most important things you can do for your health. Most adults should:  Exercise for at least 150 minutes each week. The exercise should increase your heart rate and make you sweat (moderate-intensity exercise).  Do strengthening exercises at least twice a week. This is in addition to the moderate-intensity exercise.  Spend less time sitting. Even light physical activity can be beneficial. Watch cholesterol and blood lipids Have your blood tested for lipids and cholesterol at 52 years of age, then have this test every 5 years. Have your cholesterol levels checked more often if:  Your lipid or cholesterol levels are high.  You are older than 52 years of age.  You are at high risk for heart disease. What should I know about cancer screening? Depending on your health history and family history, you may need to have cancer screening at various ages. This may include screening for:  Breast cancer.  Cervical cancer.  Colorectal  cancer.  Skin cancer.  Lung cancer. What should I know about heart disease, diabetes, and high blood pressure? Blood pressure and heart disease  High blood pressure causes heart disease and increases the risk of stroke. This is more likely to develop in people who have high blood pressure readings, are of African descent, or are overweight.  Have your blood pressure checked: ? Every 3-5 years if you are 74-35 years of age. ? Every year if you are 19 years old or older. Diabetes Have regular diabetes screenings. This checks your fasting blood sugar level. Have the screening done:  Once every three years after age 37 if you are at a normal weight and have a low risk for diabetes.  More often and at a younger age if you are overweight or have a high risk for diabetes. What should I know about preventing infection? Hepatitis B If you have a higher risk for hepatitis B, you should be screened for this virus. Talk with your health care provider to find out if you are at risk for hepatitis B infection. Hepatitis C Testing is recommended for:  Everyone born from [birth year] through 1963.  Anyone with known risk factors for hepatitis C. Sexually transmitted infections (STIs)  Get screened for STIs, including gonorrhea and chlamydia, if: ? You are sexually active and are younger than 52 years of age. ? You are older than 52 years of age and your health care provider tells you that you are at risk for this type of infection. ? Your sexual activity has changed since you were  last screened, and you are at increased risk for chlamydia or gonorrhea. Ask your health care provider if you are at risk.  Ask your health care provider about whether you are at high risk for HIV. Your health care provider may recommend a prescription medicine to help prevent HIV infection. If you choose to take medicine to prevent HIV, you should first get tested for HIV. You should then be tested every 3 months for as long as  you are taking the medicine. Pregnancy  If you are about to stop having your period (premenopausal) and you may become pregnant, seek counseling before you get pregnant.  Take 400 to 800 micrograms (mcg) of folic acid every day if you become pregnant.  Ask for birth control (contraception) if you want to prevent pregnancy. Osteoporosis and menopause Osteoporosis is a disease in which the bones lose minerals and strength with aging. This can result in bone fractures. If you are 32 years old or older, or if you are at risk for osteoporosis and fractures, ask your health care provider if you should:  Be screened for bone loss.  Take a calcium or vitamin D supplement to lower your risk of fractures.  Be given hormone replacement therapy (HRT) to treat symptoms of menopause. Follow these instructions at home: Lifestyle  Do not use any products that contain nicotine or tobacco, such as cigarettes, e-cigarettes, and chewing tobacco. If you need help quitting, ask your health care provider.  Do not use street drugs.  Do not share needles.  Ask your health care provider for help if you need support or information about quitting drugs. Alcohol use  Do not drink alcohol if: ? Your health care provider tells you not to drink. ? You are pregnant, may be pregnant, or are planning to become pregnant.  If you drink alcohol: ? Limit how much you use to 0-1 drink a day. ? Limit intake if you are breastfeeding.  Be aware of how much alcohol is in your drink. In the [place] one drink equals one 12 oz bottle of beer (355 mL), one 5 oz glass of wine (148 mL), or one 1 oz glass of hard liquor (44 mL). General instructions  Schedule regular health, dental, and eye exams.  Stay current with your vaccines.  Tell your health care provider if: ? You often feel depressed. ? You have ever been abused or do not feel safe at home. Summary  Adopting a healthy lifestyle and getting preventive care are  important in promoting health and wellness.  Follow your health care provider's instructions about healthy diet, exercising, and getting tested or screened for diseases.  Follow your health care provider's instructions on monitoring your cholesterol and blood pressure. This information is not intended to replace advice given to you by your health care provider. Make sure you discuss any questions you have with your health care provider. Document Revised: 07/05/2018 Document Reviewed: 07/05/2018 Elsevier Patient Education  2020 [employer].

## 2019-09-25 NOTE — Telephone Encounter (Signed)
Left message on voicemail it will be sent off.

## 2019-09-25 NOTE — Telephone Encounter (Signed)
Patient was seen today, and reports pap smear was done, last pap 04/2017. Patient said you told her no need to send pap to lab, due to normal pap smear in 2018. Patient now said she would like pap sent to lab. Please advise

## 2019-09-25 NOTE — Addendum Note (Signed)
Addended by: Tito Dine on: 09/25/2019 01:56 PM   Modules accepted: Orders

## 2019-09-25 NOTE — Telephone Encounter (Signed)
Okay, tell her I will send it

## 2019-09-25 NOTE — Progress Notes (Signed)
Erika Tran 05/01/1966 [id number]    History:    Presents for annual exam.  09/2017 Mirena IUD amenorrheic having occasional hot flashes.  Had labs drawn 02/2019 lipid panel, CMP and CBC normal.  2001 LGSIL with normal Paps after.  Has not had a screening colonoscopy.  07/2018 mammogram normal after ultrasound.  Paternal grandmother breast cancer history.  Had gestational diabetes with 1 pregnancy.  Past medical history, past surgical history, family history and social history were all reviewed and documented in the EPIC chart.  Does housecleaning.  2 children out of college, 2 children in college 1 daughter planning marriage next year.  All doing well.  ROS:  A ROS was performed and pertinent positives and negatives are included.  Exam:  Vitals:   09/25/19 0756  BP: 120/82  Weight: 179 lb (81.2 kg)  Height: 5\' 6"  (1.676 m)   Body mass index is 28.89 kg/m.   General appearance:  Normal Thyroid:  Symmetrical, normal in size, without palpable masses or nodularity. Respiratory  Auscultation:  Clear without wheezing or rhonchi Cardiovascular  Auscultation:  Regular rate, without rubs, murmurs or gallops  Edema/varicosities:  Not grossly evident Abdominal  Soft,nontender, without masses, guarding or rebound.  Liver/spleen:  No organomegaly noted  Hernia: Nontender umbilical hernia   Skin  Inspection:  Grossly normal   Breasts: Examined lying and sitting.     Right: Without masses, retractions, discharge or axillary adenopathy.     Left: Without masses, retractions, discharge or axillary adenopathy. Gentitourinary   Inguinal/mons:  Normal without inguinal adenopathy  External genitalia:  Normal  BUS/Urethra/Skene's glands:  Normal  Vagina:  Normal  Cervix:  Normal IUD strings visible  Uterus:   normal in size, shape and contour.  Midline and mobile  Adnexa/parametria:     Rt: Without masses or tenderness.   Lt: Without masses or tenderness.  Anus and  perineum: Normal  Digital rectal exam: Normal sphincter tone without palpated masses or tenderness  Assessment/Plan:  54 y.o. MWF G4, P4 for annual exam with no complaints of urinary symptoms, vaginal discharge, abdominal pain.  08/2017 Mirena IUD amenorrheic occasional hot flashes 2001 LGSIL with normal Paps after Labs 02/2019/normal Asymptomatic umbilical hernia  Plan: Screening colonoscopy discussed, [facility] information given instructed to schedule.  Mammogram due will get scheduled.  Exercise, calcium rich foods, vitamin D 2000 IUs daily encouraged.  Self-care, leisure activities encouraged.  04/2017 normal Pap the screening guidelines reviewed.    05/2017 [facility], 8:34 AM 09/25/2019

## 2019-09-26 LAB — URINALYSIS, COMPLETE W/RFL CULTURE
Bacteria, UA: NONE SEEN /HPF
Bilirubin Urine: NEGATIVE
Glucose, UA: NEGATIVE
Hgb urine dipstick: NEGATIVE
Hyaline Cast: NONE SEEN /LPF
Ketones, ur: NEGATIVE
Leukocyte Esterase: NEGATIVE
Nitrites, Initial: NEGATIVE
Protein, ur: NEGATIVE
RBC / HPF: NONE SEEN /HPF (ref 0–2)
Specific Gravity, Urine: 1.018 (ref 1.001–1.03)
WBC, UA: NONE SEEN /HPF (ref 0–5)
pH: 6.5 (ref 5.0–8.0)

## 2019-09-26 LAB — PAP, TP IMAGING W/ HPV RNA, RFLX HPV TYPE 16,18/45: HPV DNA High Risk: NOT DETECTED

## 2019-09-26 LAB — NO CULTURE INDICATED

## 2019-11-10 ENCOUNTER — Ambulatory Visit: Payer: Self-pay | Attending: Internal Medicine

## 2019-11-10 DIAGNOSIS — Z23 Encounter for immunization: Secondary | ICD-10-CM

## 2019-11-10 NOTE — Progress Notes (Signed)
   Covid-19 Vaccination Clinic  Name:  Erika Tran    MRN: [id number] DOB: 1965-11-17  11/10/2019  Erika Tran was observed post Covid-19 immunization for 15 minutes without incident. She was provided with Vaccine Information Sheet and instruction to access the V-Safe system.   Erika Tran was instructed to call 911 with any severe reactions post vaccine: Marland Kitchen Difficulty breathing  . Swelling of face and throat  . A fast heartbeat  . A bad rash all over body  . Dizziness and weakness   Immunizations Administered    Name Date Dose VIS Date Route   Pfizer COVID-19 Vaccine 11/10/2019  5:04 PM 0.3 mL 07/06/2019 Intramuscular   Manufacturer: [employer], [employer]   Lot: [id number]   NDC: [address]-0

## 2019-12-04 ENCOUNTER — Ambulatory Visit: Payer: Self-pay

## 2019-12-08 ENCOUNTER — Ambulatory Visit: Payer: Self-pay | Attending: Internal Medicine

## 2019-12-08 DIAGNOSIS — Z23 Encounter for immunization: Secondary | ICD-10-CM

## 2019-12-08 NOTE — Progress Notes (Signed)
   Covid-19 Vaccination Clinic  Name:  JANITH NIELSON    MRN: [id number] DOB: 1966/04/14  12/08/2019  Ms. Florer was observed post Covid-19 immunization for 15 minutes without incident. She was provided with Vaccine Information Sheet and instruction to access the V-Safe system.   Ms. Sparlin was instructed to call 911 with any severe reactions post vaccine: Marland Kitchen Difficulty breathing  . Swelling of face and throat  . A fast heartbeat  . A bad rash all over body  . Dizziness and weakness   Immunizations Administered    Name Date Dose VIS Date Route   Pfizer COVID-19 Vaccine 12/08/2019 10:23 AM 0.3 mL 09/19/2018 Intramuscular   Manufacturer: [employer], [employer]   Lot: [id number]   NDC: [address]-8

## 2020-08-17 IMAGING — MG DIGITAL DIAGNOSTIC UNILATERAL RIGHT MAMMOGRAM WITH TOMO AND CAD
6 series · 6 of 18 positions shown · non-contrast
Comparison: Previous exam(s).

CLINICAL DATA: Right breast asymmetry seen on baseline screening
mammography.

EXAM:
DIGITAL DIAGNOSTIC RIGHT MAMMOGRAM WITH CAD AND TOMO
ULTRASOUND RIGHT BREAST

[R MLO synth-2D]
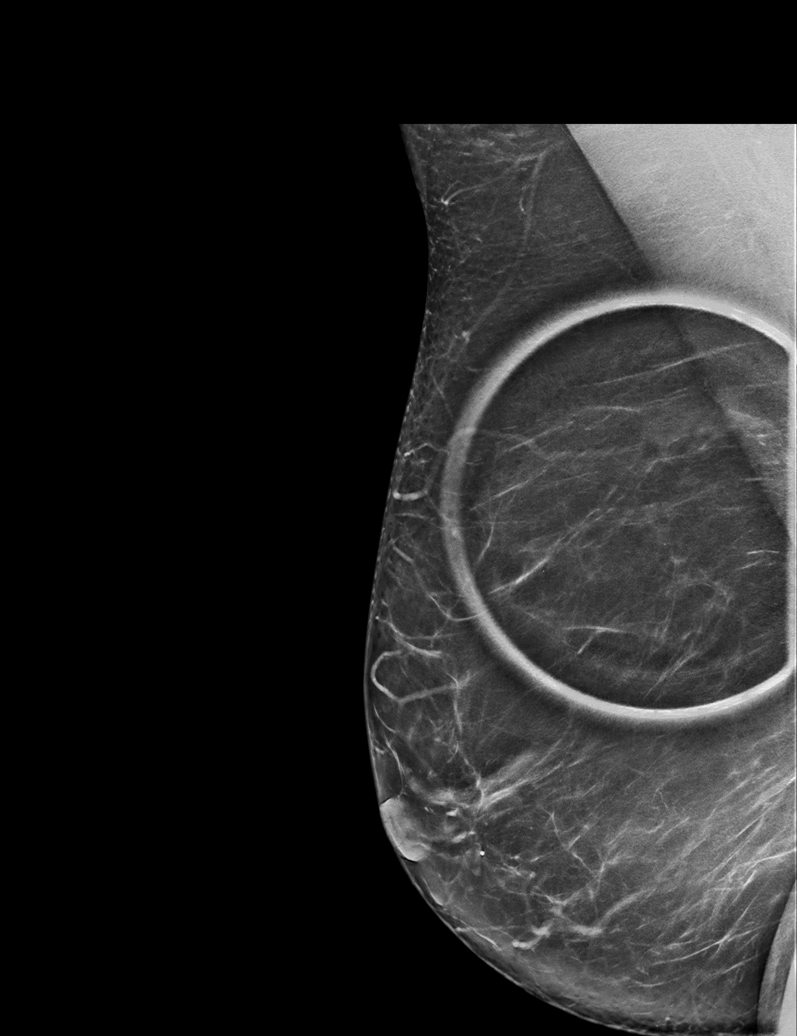

[R XCCL synth-2D]
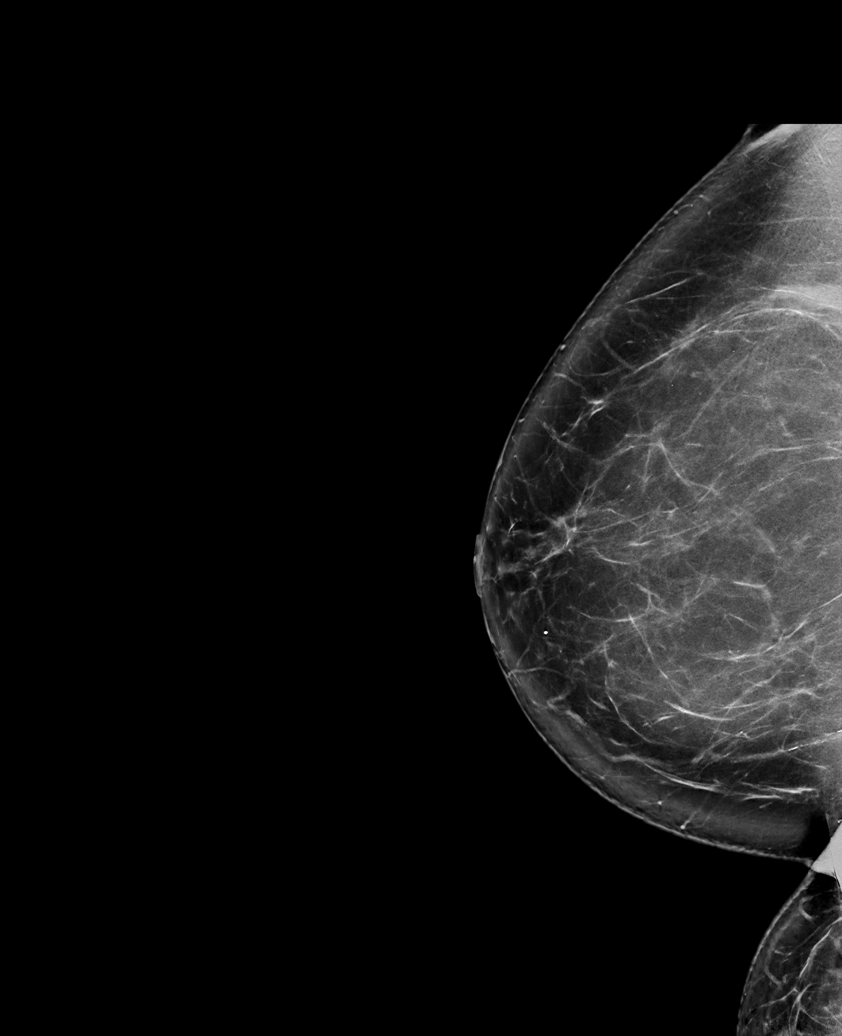

[R ML synth-2D]
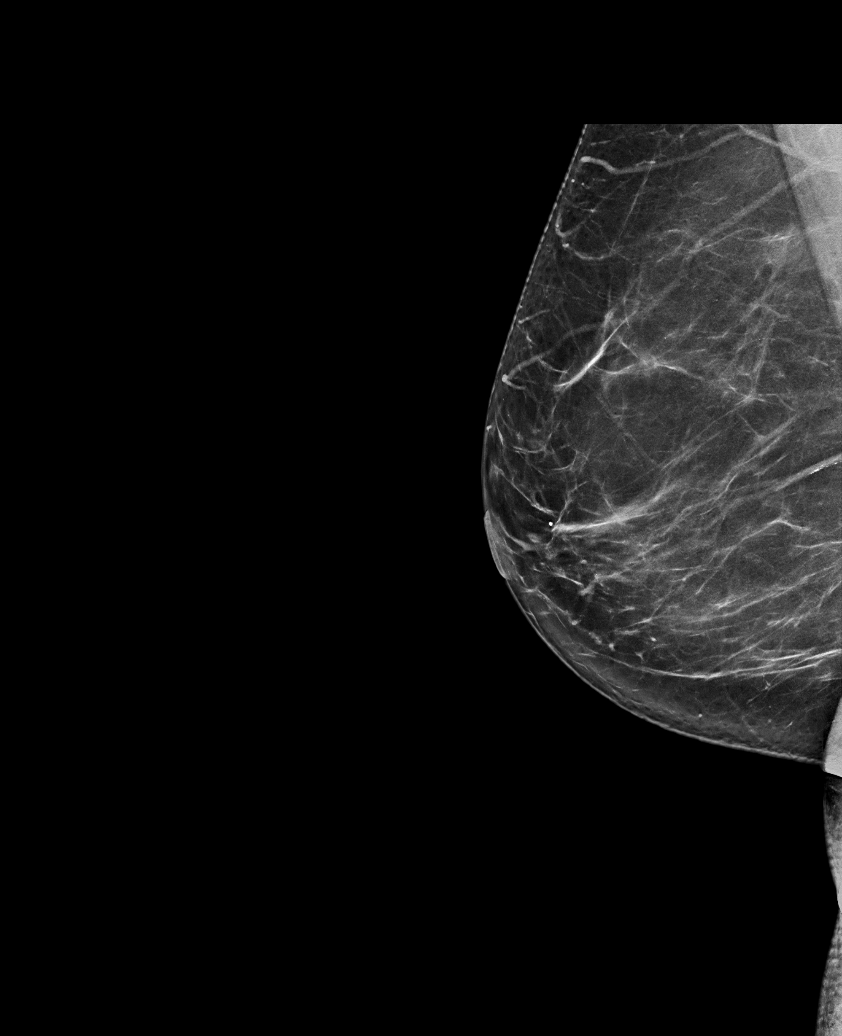

[R XCCL tomo · tomo slice 48/95.0]
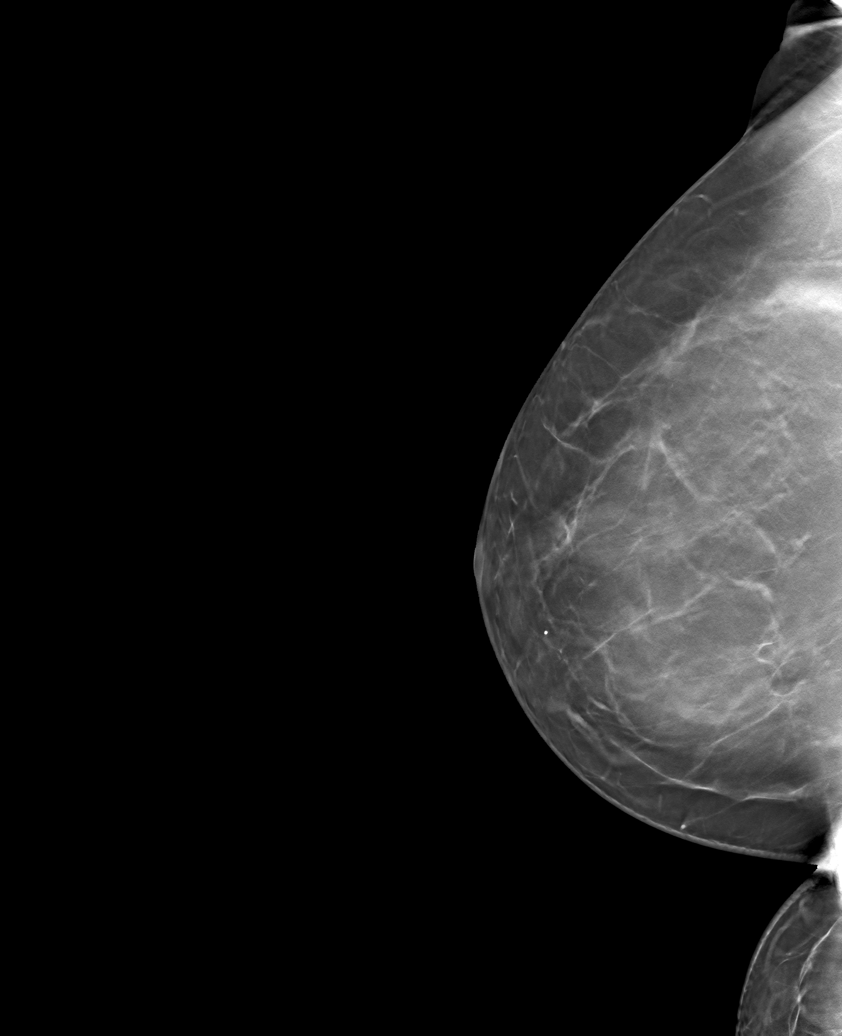

[R MLO tomo · tomo slice 46/91.0]
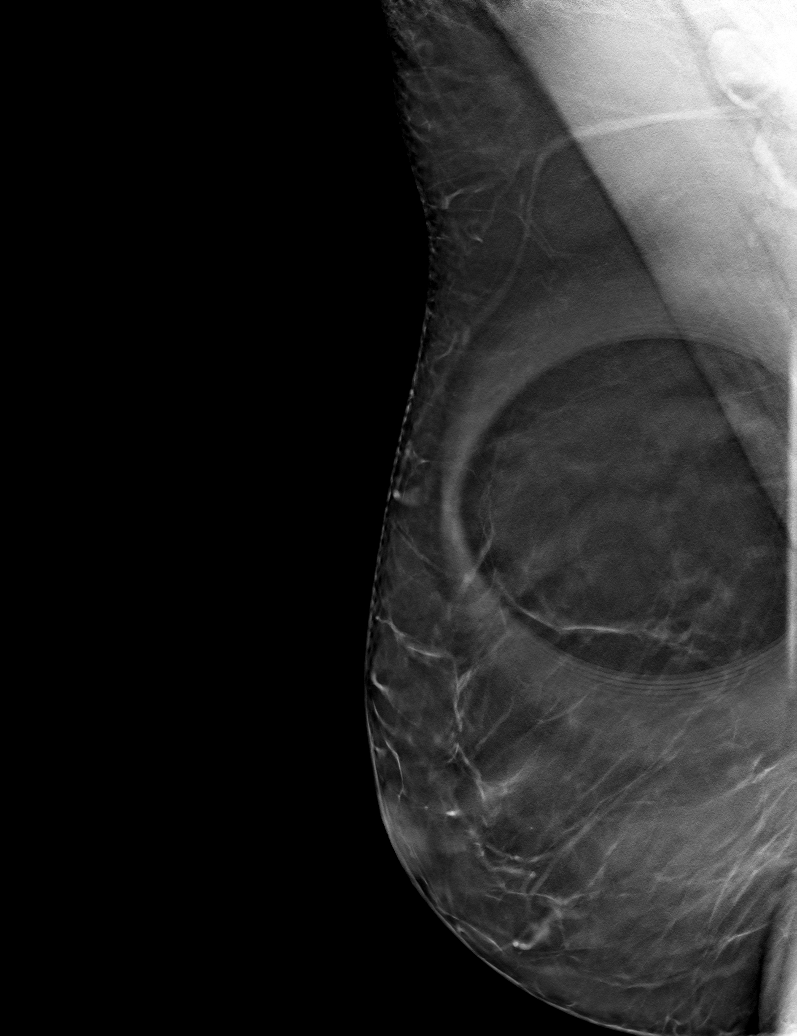

[R ML tomo · tomo slice 44/87.0]
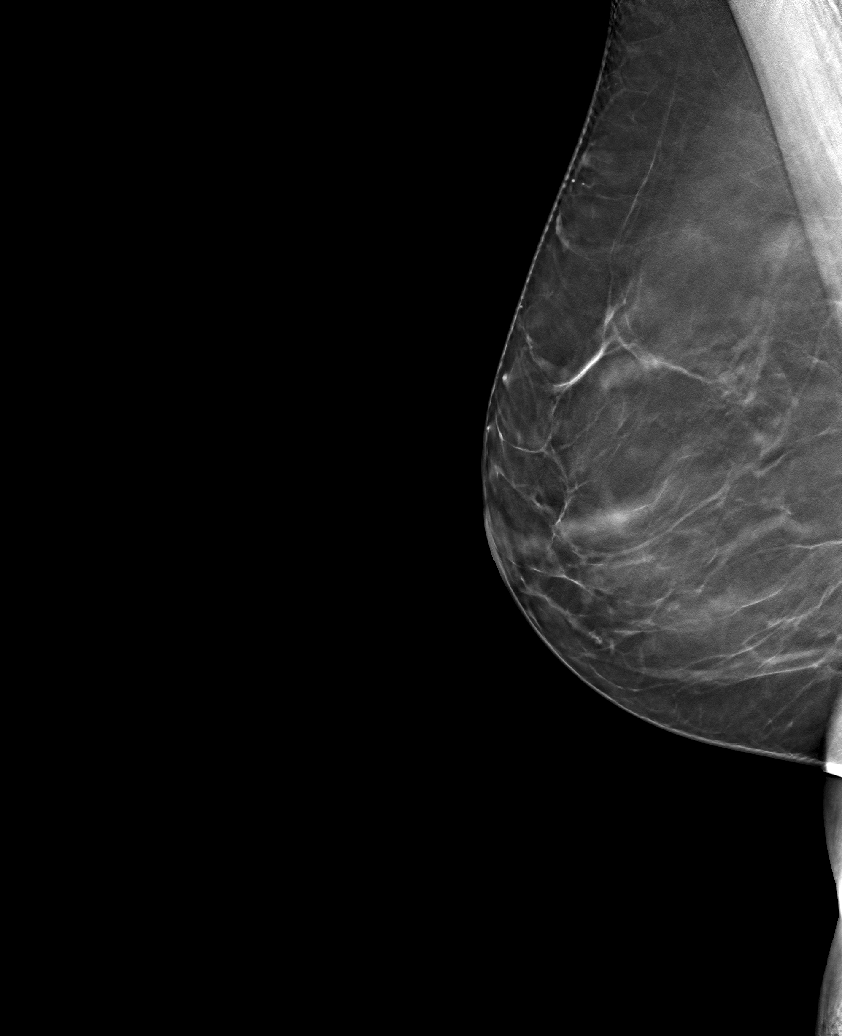

[6 of 18 positions shown; findings below may reference images not displayed]

ACR Breast Density Category b: There are scattered areas of
fibroglandular density.
FINDINGS: Mammographically, there are no suspicious masses or areas of
architectural distortion. There is a persistent non mass asymmetry
in the axillary tail of the right breast, far posterior depth, with
appearance of dense fibroglandular tissue.

Mammographic images were processed with CAD.

On physical exam, no suspicious masses are palpated.

Targeted ultrasound is performed, showing no suspicious masses or
shadowing lesions. There is an island of dense fibroglandular tissue
in the axillary tail of the right breast, far posterior depth, which
corresponds to the mammographically seen focal asymmetry.
IMPRESSION: No mammographic or sonographic evidence of malignancy in the right
breast.

RECOMMENDATION:
Screening mammogram in one year.(Code:G1-T-9D7)

I have discussed the findings and recommendations with the patient.
Results were also provided in writing at the conclusion of the
visit. If applicable, a reminder letter will be sent to the patient
regarding the next appointment.

BI-RADS CATEGORY  2: Benign.

## 2020-09-30 ENCOUNTER — Encounter: Payer: Managed Care, Other (non HMO) | Admitting: Nurse Practitioner

## 2020-12-08 ENCOUNTER — Ambulatory Visit (INDEPENDENT_AMBULATORY_CARE_PROVIDER_SITE_OTHER): Payer: Managed Care, Other (non HMO) | Admitting: Nurse Practitioner

## 2020-12-08 ENCOUNTER — Encounter: Payer: Self-pay | Admitting: Nurse Practitioner

## 2020-12-08 ENCOUNTER — Other Ambulatory Visit: Payer: Self-pay

## 2020-12-08 VITALS — BP 124/78 | Ht 65.0 in | Wt 170.0 lb

## 2020-12-08 DIAGNOSIS — Z01419 Encounter for gynecological examination (general) (routine) without abnormal findings: Secondary | ICD-10-CM

## 2020-12-08 DIAGNOSIS — Z30431 Encounter for routine checking of intrauterine contraceptive device: Secondary | ICD-10-CM

## 2020-12-08 DIAGNOSIS — R232 Flushing: Secondary | ICD-10-CM

## 2020-12-08 DIAGNOSIS — N912 Amenorrhea, unspecified: Secondary | ICD-10-CM

## 2020-12-08 NOTE — Progress Notes (Signed)
   Erika Tran [birth year] [id number]   History:  55 y.o. [id number] presents for annual exam. Amenorrheic. Mirena IUD inserted 09/2017. Complains of occasional hot flashes, insomnia, and difficult losing weight. 2001 LGSIL, subsequent paps normal.   Gynecologic History No LMP recorded. (Menstrual status: IUD).   Contraception/Family planning: IUD  Health Maintenance Last Pap: 09/25/2019. Results were: normal Last mammogram: 07/2018 negative after ulrasound. Results were: normal Last colonoscopy: Never Last Dexa: Not indicated  Past medical history, past surgical history, family history and social history were all reviewed and documented in the EPIC chart.  ROS:  A ROS was performed and pertinent positives and negatives are included.  Exam:  Vitals:   12/08/20 1636  BP: 124/78  Weight: 170 lb (77.1 kg)  Height: 5\' 5"  (1.651 m)   Body mass index is 28.29 kg/m.  General appearance:  Normal Thyroid:  Symmetrical, normal in size, without palpable masses or nodularity. Respiratory  Auscultation:  Clear without wheezing or rhonchi Cardiovascular  Auscultation:  Regular rate, without rubs, murmurs or gallops  Edema/varicosities:  Not grossly evident Abdominal  Soft,nontender, without masses, guarding or rebound.  Liver/spleen:  No organomegaly noted  Hernia:  None appreciated  Skin  Inspection:  Grossly normal Breasts: Examined lying and sitting.   Right: Without masses, retractions, nipple discharge or axillary adenopathy.   Left: Without masses, retractions, nipple discharge or axillary adenopathy. Genitourinary   Inguinal/mons:  Normal without inguinal adenopathy  External genitalia:  Normal appearing vulva with no masses, tenderness, or lesions  BUS/Urethra/Skene's glands:  Normal  Vagina:  Normal appearing with normal color and discharge, no lesions  Cervix:  Normal appearing without discharge or lesions. IUD string visible at exocervis  Uterus:  Normal in size, shape  and contour.  Midline and mobile, nontender  Adnexa/parametria:     Rt: Normal in size, without masses or tenderness.   Lt: Normal in size, without masses or tenderness.  Anus and perineum: Normal  Digital rectal exam: Normal sphincter tone without palpated masses or tenderness  Assessment/Plan:  55 y.o. 44 for annual exam.   Well female exam with routine gynecological exam - Plan: CBC with Differential/Platelet, Comprehensive metabolic panel, Lipid panel, TSH. Education provided on SBEs, importance of preventative screenings, current guidelines, high calcium diet, regular exercise, and multivitamin daily.   Amenorrhea - Plan: Follicle stimulating hormone. Mirena IUD.   Hot flashes - Plan: Follicle stimulating hormone, TSH. Occasional hot flashes.   Encounter for routine checking of intrauterine contraceptive device (IUD) - Mirena IUD inserted 09/2017. We discussed removing if FSH in postmenopausal range.   Screening for cervical cancer - 2001 LGSIL, subsequent paps normal. Will repeat at 5-year interval per guidelines.  Screening for breast cancer - Normal mammogram history.  Normal breast exam today. Overdue for mammogram and encouraged to schedule.   Screening for colon cancer - Has not had screening colonoscopy. Discussed current guidelines and importance of preventative screening. Information provided on [facility].   Will return for fasting lab work. Return in 1 year for annual.      2002 DNP, 4:54 PM 12/08/2020

## 2020-12-08 NOTE — Patient Instructions (Addendum)
Erika Tran, Ginseng, OR [place]  Schedule colonoscopy! [facility] [id number] [address] [place], [place] [address]  Health Maintenance, Female Adopting a healthy lifestyle and getting preventive care are important in promoting health and wellness. Ask your health care provider about:  The right schedule for you to have regular tests and exams.  Things you can do on your own to prevent diseases and keep yourself healthy. What should I know about diet, weight, and exercise? Eat a healthy diet  Eat a diet that includes plenty of vegetables, fruits, low-fat dairy products, and lean protein.  Do not eat a lot of foods that are high in solid fats, added sugars, or sodium.   Maintain a healthy weight Body mass index (BMI) is used to identify weight problems. It estimates body fat based on height and weight. Your health care provider can help determine your BMI and help you achieve or maintain a healthy weight. Get regular exercise Get regular exercise. This is one of the most important things you can do for your health. Most adults should:  Exercise for at least 150 minutes each week. The exercise should increase your heart rate and make you sweat (moderate-intensity exercise).  Do strengthening exercises at least twice a week. This is in addition to the moderate-intensity exercise.  Spend less time sitting. Even light physical activity can be beneficial. Watch cholesterol and blood lipids Have your blood tested for lipids and cholesterol at 53 years of age, then have this test every 5 years. Have your cholesterol levels checked more often if:  Your lipid or cholesterol levels are high.  You are older than 53 years of age.  You are at high risk for heart disease. What should I know about cancer screening? Depending on your health history and family history, you may need to have cancer screening at various ages. This may include screening for:  Breast  cancer.  Cervical cancer.  Colorectal cancer.  Skin cancer.  Lung cancer. What should I know about heart disease, diabetes, and high blood pressure? Blood pressure and heart disease  High blood pressure causes heart disease and increases the risk of stroke. This is more likely to develop in people who have high blood pressure readings, are of African descent, or are overweight.  Have your blood pressure checked: ? Every 3-5 years if you are 88-25 years of age. ? Every year if you are 60 years old or older. Diabetes Have regular diabetes screenings. This checks your fasting blood sugar level. Have the screening done:  Once every three years after age 79 if you are at a normal weight and have a low risk for diabetes.  More often and at a younger age if you are overweight or have a high risk for diabetes. What should I know about preventing infection? Hepatitis B If you have a higher risk for hepatitis B, you should be screened for this virus. Talk with your health care provider to find out if you are at risk for hepatitis B infection. Hepatitis C Testing is recommended for:  Everyone born from [birth year] through 1963.  Anyone with known risk factors for hepatitis C. Sexually transmitted infections (STIs)  Get screened for STIs, including gonorrhea and chlamydia, if: ? You are sexually active and are younger than 53 years of age. ? You are older than 53 years of age and your health care provider tells you that you are at risk for this type of infection. ? Your sexual activity has  changed since you were last screened, and you are at increased risk for chlamydia or gonorrhea. Ask your health care provider if you are at risk.  Ask your health care provider about whether you are at high risk for HIV. Your health care provider may recommend a prescription medicine to help prevent HIV infection. If you choose to take medicine to prevent HIV, you should first get tested for HIV. You should  then be tested every 3 months for as long as you are taking the medicine. Pregnancy  If you are about to stop having your period (premenopausal) and you may become pregnant, seek counseling before you get pregnant.  Take 400 to 800 micrograms (mcg) of folic acid every day if you become pregnant.  Ask for birth control (contraception) if you want to prevent pregnancy. Osteoporosis and menopause Osteoporosis is a disease in which the bones lose minerals and strength with aging. This can result in bone fractures. If you are 47 years old or older, or if you are at risk for osteoporosis and fractures, ask your health care provider if you should:  Be screened for bone loss.  Take a calcium or vitamin D supplement to lower your risk of fractures.  Be given hormone replacement therapy (HRT) to treat symptoms of menopause. Follow these instructions at home: Lifestyle  Do not use any products that contain nicotine or tobacco, such as cigarettes, e-cigarettes, and chewing tobacco. If you need help quitting, ask your health care provider.  Do not use street drugs.  Do not share needles.  Ask your health care provider for help if you need support or information about quitting drugs. Alcohol use  Do not drink alcohol if: ? Your health care provider tells you not to drink. ? You are pregnant, may be pregnant, or are planning to become pregnant.  If you drink alcohol: ? Limit how much you use to 0-1 drink a day. ? Limit intake if you are breastfeeding.  Be aware of how much alcohol is in your drink. In the [place] one drink equals one 12 oz bottle of beer (355 mL), one 5 oz glass of wine (148 mL), or one 1 oz glass of hard liquor (44 mL). General instructions  Schedule regular health, dental, and eye exams.  Stay current with your vaccines.  Tell your health care provider if: ? You often feel depressed. ? You have ever been abused or do not feel safe at home. Summary  Adopting a  healthy lifestyle and getting preventive care are important in promoting health and wellness.  Follow your health care provider's instructions about healthy diet, exercising, and getting tested or screened for diseases.  Follow your health care provider's instructions on monitoring your cholesterol and blood pressure. This information is not intended to replace advice given to you by your health care provider. Make sure you discuss any questions you have with your health care provider. Document Revised: 07/05/2018 Document Reviewed: 07/05/2018 Elsevier Patient Education  2021 [employer].

## 2021-01-14 ENCOUNTER — Other Ambulatory Visit: Payer: Self-pay

## 2021-01-14 ENCOUNTER — Other Ambulatory Visit: Payer: Managed Care, Other (non HMO)

## 2021-01-14 DIAGNOSIS — N912 Amenorrhea, unspecified: Secondary | ICD-10-CM

## 2021-01-14 DIAGNOSIS — R232 Flushing: Secondary | ICD-10-CM

## 2021-01-14 DIAGNOSIS — Z01419 Encounter for gynecological examination (general) (routine) without abnormal findings: Secondary | ICD-10-CM

## 2021-01-14 LAB — LIPID PANEL
Cholesterol: 123 mg/dL (ref <200)
HDL: 55 mg/dL (ref >=50)
LDL Cholesterol (Calc): 59 mg/dL (calc)
Non-HDL Cholesterol (Calc): 68 mg/dL (calc) (ref <130)
Total CHOL/HDL Ratio: 2.2 (calc) (ref <5.0)
Triglycerides: 32 mg/dL (ref <150)

## 2021-01-14 LAB — COMPREHENSIVE METABOLIC PANEL
AG Ratio: 1.9 (calc) (ref 1.0–2.5)
ALT: 16 U/L (ref 6–29)
AST: 16 U/L (ref 10–35)
Albumin: 4.3 g/dL (ref 3.6–5.1)
Alkaline phosphatase (APISO): 73 U/L (ref 37–153)
BUN: 14 mg/dL (ref 7–25)
CO2: 29 mmol/L (ref 20–32)
Calcium: 9.3 mg/dL (ref 8.6–10.4)
Chloride: 104 mmol/L (ref 98–110)
Creat: 0.64 mg/dL (ref 0.50–1.05)
Globulin: 2.3 g/dL (calc) (ref 1.9–3.7)
Glucose, Bld: 83 mg/dL (ref 65–99)
Potassium: 4.6 mmol/L (ref 3.5–5.3)
Sodium: 140 mmol/L (ref 135–146)
Total Bilirubin: 0.5 mg/dL (ref 0.2–1.2)
Total Protein: 6.6 g/dL (ref 6.1–8.1)

## 2021-01-14 LAB — CBC WITH DIFFERENTIAL/PLATELET
Absolute Monocytes: 462 cells/uL (ref 200–950)
Basophils Absolute: 33 cells/uL (ref 0–200)
Basophils Relative: 0.5 %
Eosinophils Absolute: 231 cells/uL (ref 15–500)
Eosinophils Relative: 3.5 %
HCT: 40.7 % (ref 35.0–45.0)
Hemoglobin: 13.4 g/dL (ref 11.7–15.5)
Lymphs Abs: 2066 cells/uL (ref 850–3900)
MCH: 28.9 pg (ref 27.0–33.0)
MCHC: 32.9 g/dL (ref 32.0–36.0)
MCV: 87.7 fL (ref 80.0–100.0)
MPV: 10.5 fL (ref 7.5–12.5)
Monocytes Relative: 7 %
Neutro Abs: 3808 cells/uL (ref 1500–7800)
Neutrophils Relative %: 57.7 %
Platelets: 299 10*3/uL (ref 140–400)
RBC: 4.64 10*6/uL (ref 3.80–5.10)
RDW: 12 % (ref 11.0–15.0)
Total Lymphocyte: 31.3 %
WBC: 6.6 10*3/uL (ref 3.8–10.8)

## 2021-01-14 LAB — TSH: TSH: 1.9 mIU/L

## 2021-01-14 LAB — FOLLICLE STIMULATING HORMONE: FSH: 105.8 m[IU]/mL

## 2021-03-09 ENCOUNTER — Telehealth: Payer: Self-pay

## 2021-03-09 NOTE — Telephone Encounter (Signed)
Patient inquiring who we recommend for her screening colonoscopy. Per TW's note at [facility] recommended [facility] and phone number provided.

## 2021-03-10 ENCOUNTER — Ambulatory Visit: Payer: Managed Care, Other (non HMO) | Admitting: Nurse Practitioner

## 2021-03-11 ENCOUNTER — Encounter: Payer: Self-pay | Admitting: Gastroenterology

## 2021-03-13 ENCOUNTER — Encounter: Payer: Self-pay | Admitting: Gastroenterology

## 2021-04-08 ENCOUNTER — Other Ambulatory Visit: Payer: Self-pay | Admitting: Nurse Practitioner

## 2021-04-08 DIAGNOSIS — Z30432 Encounter for removal of intrauterine contraceptive device: Secondary | ICD-10-CM

## 2021-04-09 ENCOUNTER — Other Ambulatory Visit: Payer: Self-pay

## 2021-04-09 ENCOUNTER — Ambulatory Visit (AMBULATORY_SURGERY_CENTER): Payer: Managed Care, Other (non HMO) | Admitting: *Deleted

## 2021-04-09 ENCOUNTER — Ambulatory Visit: Payer: Managed Care, Other (non HMO) | Admitting: Nurse Practitioner

## 2021-04-09 VITALS — Ht 66.0 in | Wt 170.0 lb

## 2021-04-09 DIAGNOSIS — Z1211 Encounter for screening for malignant neoplasm of colon: Secondary | ICD-10-CM

## 2021-04-09 MED ORDER — PEG-KCL-NACL-NASULF-NA ASC-C 100 G PO SOLR: 1.0000 | Freq: Once | ORAL | 0 refills | Status: AC

## 2021-04-09 NOTE — Progress Notes (Signed)
Virtual pre-visit completed over the telephone. Instructions sent to email [email or web address] and mailed a cop   Patient reports  headaches with eating eggs as a meal, ok if cooked in a recipe  No allergy to soy.   No issues known to pt with past sedation with any surgeries or procedures Patient denies ever being told they had issues or difficulty with intubation  No FH of Malignant Hyperthermia Pt is not on diet pills Pt is not on  home 02  Pt is not on blood thinners  Pt denies issues with constipation  No A fib or A flutter  EMMI video to pt or via MyChart  COVID 19 guidelines implemented in PV today with Pt and RN   Pt is fully vaccinated  for [employer] mailed to patient., Code to Pharmacy and  NO PA's for preps discussed with pt In PV today  Discussed with pt there will be an out-of-pocket cost for prep and that varies from $0 to 70 +  dollars   Due to the COVID-19 pandemic we are asking patients to follow certain guidelines.  Pt aware of COVID protocols and [facility] guidelines

## 2021-04-14 ENCOUNTER — Encounter: Payer: Self-pay | Admitting: Gastroenterology

## 2021-04-27 ENCOUNTER — Encounter: Payer: Self-pay | Admitting: Gastroenterology

## 2021-04-27 ENCOUNTER — Ambulatory Visit (AMBULATORY_SURGERY_CENTER): Payer: Managed Care, Other (non HMO) | Admitting: Gastroenterology

## 2021-04-27 ENCOUNTER — Other Ambulatory Visit: Payer: Self-pay

## 2021-04-27 VITALS — BP 132/86 | HR 51 | Temp 98.0°F | Resp 17 | Ht 65.0 in | Wt 170.0 lb

## 2021-04-27 DIAGNOSIS — Z1211 Encounter for screening for malignant neoplasm of colon: Secondary | ICD-10-CM | POA: Diagnosis not present

## 2021-04-27 MED ORDER — SODIUM CHLORIDE 0.9 % IV SOLN: 500.0000 mL | Freq: Once | INTRAVENOUS | Status: DC

## 2021-04-27 NOTE — Progress Notes (Signed)
C.W. vital signs. 

## 2021-04-27 NOTE — Op Note (Signed)
[facility] Patient Name: Erika Tran Procedure Date: 04/27/2021 7:59 AM MRN: [id number] Endoscopist: Sherilyn Cooter L. Myrtie Neither , MD Age: 55 Referring MD:  Date of Birth: 04-16-1966 Gender: Female Account #: [id number] Procedure:                Colonoscopy Indications:              Screening for colorectal malignant neoplasm, This                            is the patient's first colonoscopy Medicines:                Monitored Anesthesia Care Procedure:                Pre-Anesthesia Assessment:                           - Prior to the procedure, a History and Physical                            was performed, and patient medications and                            allergies were reviewed. The patient's tolerance of                            previous anesthesia was also reviewed. The risks                            and benefits of the procedure and the sedation                            options and risks were discussed with the patient.                            All questions were answered, and informed consent                            was obtained. Prior Anticoagulants: The patient has                            taken no previous anticoagulant or antiplatelet                            agents. ASA Grade Assessment: II - A patient with                            mild systemic disease. After reviewing the risks                            and benefits, the patient was deemed in                            satisfactory condition to undergo the procedure.  After obtaining informed consent, the colonoscope                            was passed under direct vision. Throughout the                            procedure, the patient's blood pressure, pulse, and                            oxygen saturations were monitored continuously. The                            CF [id number] #[id number] was introduced through the anus                            and advanced to the the  cecum, identified by                            appendiceal orifice and ileocecal valve. The                            colonoscopy was performed without difficulty. The                            patient tolerated the procedure well. The quality                            of the bowel preparation was good. The ileocecal                            valve, appendiceal orifice, and rectum were                            photographed. Scope In: 8:06:52 AM Scope Out: 8:23:07 AM Scope Withdrawal Time: 0 hours 12 minutes 2 seconds  Total Procedure Duration: 0 hours 16 minutes 15 seconds  Findings:                 The perianal and digital rectal examinations were                            normal.                           There is no endoscopic evidence of polyps in the                            entire colon.                           Multiple small-mouthed diverticula were found in                            the sigmoid colon.  The exam was otherwise without abnormality on                            direct and retroflexion views. Complications:            No immediate complications. Estimated Blood Loss:     Estimated blood loss: none. Impression:               - Diverticulosis in the sigmoid colon.                           - The examination was otherwise normal on direct                            and retroflexion views.                           - No specimens collected. Recommendation:           - Patient has a contact number available for                            emergencies. The signs and symptoms of potential                            delayed complications were discussed with the                            patient. Return to normal activities tomorrow.                            Written discharge instructions were provided to the                            patient.                           - Resume previous diet.                           - Continue  present medications.                           - Repeat colonoscopy in 10 years for screening                            purposes. Lothar Prehn L. Myrtie Neither, MD 04/27/2021 8:27:52 AM This report has been signed electronically.

## 2021-04-27 NOTE — Progress Notes (Signed)
Pt Drowsy. VSS. To PACU, report to RN. No anesthetic complications noted.  

## 2021-04-27 NOTE — Patient Instructions (Signed)
YOU HAD AN ENDOSCOPIC PROCEDURE TODAY AT THE [facility]:   Refer to the procedure report that was given to you for any specific questions about what was found during the examination.  If the procedure report does not answer your questions, please call your gastroenterologist to clarify.  If you requested that your care partner not be given the details of your procedure findings, then the procedure report has been included in a sealed envelope for you to review at your convenience later.  YOU SHOULD EXPECT: Some feelings of bloating in the abdomen. Passage of more gas than usual.  Walking can help get rid of the air that was put into your GI tract during the procedure and reduce the bloating. If you had a lower endoscopy (such as a colonoscopy or flexible sigmoidoscopy) you may notice spotting of blood in your stool or on the toilet paper. If you underwent a bowel prep for your procedure, you may not have a normal bowel movement for a few days.  Please Note:  You might notice some irritation and congestion in your nose or some drainage.  This is from the oxygen used during your procedure.  There is no need for concern and it should clear up in a day or so.  SYMPTOMS TO REPORT IMMEDIATELY:  Following lower endoscopy (colonoscopy or flexible sigmoidoscopy):  Excessive amounts of blood in the stool  Significant tenderness or worsening of abdominal pains  Swelling of the abdomen that is new, acute  Fever of 100F or higher  For urgent or emergent issues, a gastroenterologist can be reached at any hour by calling [phone]. Do not use MyChart messaging for urgent concerns.    DIET:  We do recommend a small meal at first, but then you may proceed to your regular diet.  Drink plenty of fluids but you should avoid alcoholic beverages for 24 hours.  ACTIVITY:  You should plan to take it easy for the rest of today and you should NOT DRIVE or use heavy machinery until tomorrow (because of  the sedation medicines used during the test).    FOLLOW UP: Our staff will call the number listed on your records 48-72 hours following your procedure to check on you and address any questions or concerns that you may have regarding the information given to you following your procedure. If we do not reach you, we will leave a message.  We will attempt to reach you two times.  During this call, we will ask if you have developed any symptoms of COVID 19. If you develop any symptoms (ie: fever, flu-like symptoms, shortness of breath, cough etc.) before then, please call [phone].  If you test positive for Covid 19 in the 2 weeks post procedure, please call and report this information to us.    If any biopsies were taken you will be contacted by phone or by letter within the next 1-3 weeks.  Please call us at [phone] if you have not heard about the biopsies in 3 weeks.    SIGNATURES/CONFIDENTIALITY: You and/or your care partner have signed paperwork which will be entered into your electronic medical record.  These signatures attest to the fact that that the information above on your After Visit Summary has been reviewed and is understood.  Full responsibility of the confidentiality of this discharge information lies with you and/or your care-partner.    Resume medications. Information given on diverticulosis. 

## 2021-04-27 NOTE — Progress Notes (Signed)
History and Physical:  This patient presents for endoscopic testing for: Encounter Diagnosis  Name Primary?   Special screening for malignant neoplasms, colon Yes    Patient without complaints today. First colonoscopy  - average risk screening    Past Medical History: Past Medical History:  Diagnosis Date   LGSIL (low grade squamous intraepithelial dysplasia) 2001   NORMAL PAPS AFTER     Past Surgical History: Past Surgical History:  Procedure Laterality Date   INTRAUTERINE DEVICE INSERTION  08/29/2017   Mirena    Allergies: No Known Allergies  Outpatient Meds: Current Outpatient Medications  Medication Sig Dispense Refill   Ascorbic Acid (VITA-C PO) Take by mouth.     Cyanocobalamin (VITAMIN B-12 PO) Take 1 tablet by mouth daily.     MAGNESIUM PO Take by mouth.     Omega-3 Fatty Acids (FISH OIL PO) Take 1 tablet by mouth daily.     VITAMIN D PO Take by mouth.     Current Facility-Administered Medications  Medication Dose Route Frequency Provider Last Rate Last Admin   0.9 %  sodium chloride infusion  500 mL Intravenous Once Danis, Andreas Blower, MD       levonorgestrel (MIRENA) 20 MCG/24HR IUD   Intrauterine Once Fontaine, Nadyne Coombes, MD          ___________________________________________________________________ Objective   Exam:  BP 134/88   Pulse 69   Temp 98 F (36.7 C)   Ht 5\' 5"  (1.651 m)   Wt 170 lb (77.1 kg)   SpO2 98%   BMI 28.29 kg/m   CV: RRR without murmur, S1/S2 Resp: clear to auscultation bilaterally, normal RR and effort noted GI: soft, no tenderness, with active bowel sounds.   Assessment: Encounter Diagnosis  Name Primary?   Special screening for malignant neoplasms, colon Yes     Plan: Colonoscopy  The benefits and risks of the planned procedure were described in detail with the patient or (when appropriate) their health care proxy.  Risks were outlined as including, but not limited to, bleeding, infection, perforation,  adverse medication reaction leading to cardiac or pulmonary decompensation, pancreatitis (if ERCP).  The limitation of incomplete mucosal visualization was also discussed.  No guarantees or warranties were given.    The patient is appropriate for an endoscopic procedure in the ambulatory setting.   - , MD

## 2021-04-27 NOTE — Progress Notes (Signed)
Pt's states no medical or surgical changes since previsit or office visit. 

## 2021-04-29 ENCOUNTER — Telehealth: Payer: Self-pay | Admitting: *Deleted

## 2021-04-29 NOTE — Telephone Encounter (Signed)
  Follow up Call-  Call back number 04/27/2021  Post procedure Call Back phone  # [phone]  Permission to leave phone message Yes  Some recent data might be hidden     Patient questions:  Do you have a fever, pain , or abdominal swelling? No. Pain Score  0 *  Have you tolerated food without any problems? Yes.    Have you been able to return to your normal activities? Yes.    Do you have any questions about your discharge instructions: Diet   No. Medications  No. Follow up visit  No.  Do you have questions or concerns about your Care? No.  Actions: * If pain score is 4 or above: No action needed, pain <4.

## 2021-05-01 ENCOUNTER — Ambulatory Visit: Payer: Managed Care, Other (non HMO) | Admitting: Obstetrics and Gynecology

## 2021-05-01 ENCOUNTER — Other Ambulatory Visit: Payer: Self-pay

## 2021-05-01 ENCOUNTER — Encounter: Payer: Self-pay | Admitting: Obstetrics and Gynecology

## 2021-05-01 VITALS — BP 136/84

## 2021-05-01 DIAGNOSIS — R350 Frequency of micturition: Secondary | ICD-10-CM

## 2021-05-01 DIAGNOSIS — R3129 Other microscopic hematuria: Secondary | ICD-10-CM | POA: Diagnosis not present

## 2021-05-01 NOTE — Progress Notes (Signed)
GYNECOLOGY  VISIT   HPI: 55 y.o.   Married White or Caucasian Hispanic or Latino  female   [phone] with No LMP recorded. (Menstrual status: IUD).   here for  frequent urination. Symptoms started in the last 1-2 days. Sometimes double voiding, voiding large to small amounts. Some urinary urgency. Some urge incontinence (not a change). No dysuria.  Slight tenderness in her mid back. She had a temp of 99. No abdominal pain.  She had a colonoscopy 4 days ago, was drinking a lot of fluids.   She has an IUD. No cycles. FSH in 105.8. Some vasomotor symptoms, some weight gain.   GYNECOLOGIC HISTORY: No LMP recorded. (Menstrual status: IUD). Contraception:IUD Menopausal hormone therapy: none        OB History     Gravida  4   Para  4   Term  4   Preterm      AB  0   Living  4      SAB      IAB      Ectopic  0   Multiple      Live Births                 Patient Active Problem List   Diagnosis Date Noted   Hx gestational diabetes 10/30/2013   Umbilical hernia 04/03/2012    Past Medical History:  Diagnosis Date   LGSIL (low grade squamous intraepithelial dysplasia) 2001   NORMAL PAPS AFTER    Past Surgical History:  Procedure Laterality Date   INTRAUTERINE DEVICE INSERTION  08/29/2017   Mirena    Current Outpatient Medications  Medication Sig Dispense Refill   Ascorbic Acid (VITA-C PO) Take by mouth.     Cyanocobalamin (VITAMIN B-12 PO) Take 1 tablet by mouth daily.     MAGNESIUM PO Take by mouth.     Omega-3 Fatty Acids (FISH OIL PO) Take 1 tablet by mouth daily.     VITAMIN D PO Take by mouth.     Current Facility-Administered Medications  Medication Dose Route Frequency Provider Last Rate Last Admin   levonorgestrel (MIRENA) 20 MCG/24HR IUD   Intrauterine Once Fontaine, Nadyne Coombes, MD         ALLERGIES: Patient has no known allergies.  Family History  Problem Relation Age of Onset   Osteoporosis Mother    Hypertension Father    Osteoporosis  Maternal Grandmother    Diabetes Maternal Grandfather    Breast cancer Paternal Grandmother    Colon polyps Neg Hx    Colon cancer Neg Hx    Esophageal cancer Neg Hx    Rectal cancer Neg Hx    Stomach cancer Neg Hx     Social History   Socioeconomic History   Marital status: Married    Spouse name: Not on file   Number of children: Not on file   Years of education: Not on file   Highest education level: Not on file  Occupational History   Not on file  Tobacco Use   Smoking status: Never   Smokeless tobacco: Never  Vaping Use   Vaping Use: Never used  Substance and Sexual Activity   Alcohol use: Yes    Comment: Rare   Drug use: No   Sexual activity: Yes    Partners: Male    Birth control/protection: I.U.D.    Comment: Mirena 08/29/2017  Other Topics Concern   Not on file  Social History Narrative   Not on file  Social Determinants of Health   Financial Resource Strain: Not on file  Food Insecurity: Not on file  Transportation Needs: Not on file  Physical Activity: Not on file  Stress: Not on file  Social Connections: Not on file  Intimate Partner Violence: Not on file    ROS  PHYSICAL EXAMINATION:    BP 136/84 (Cuff Size: Normal)     General appearance: alert, cooperative and appears stated age CVA: not tender Abdomen: soft, non-tender; non distended, no masses,  no organomegaly  1. Frequent urination with urgency - Urinalysis,Complete w/RFL Culture: 0-5 WBC, 3-10 RBC, moderate bacteria -She will use Azo for the next 48 hours.   2. Microscopic hematuria See above - Urinalysis,Complete w/RFL Culture -If her culture is negative for infection, she needs to return for a repeat urinalysis to look for blood.

## 2021-05-03 LAB — URINALYSIS, COMPLETE W/RFL CULTURE
Bilirubin Urine: NEGATIVE
Glucose, UA: NEGATIVE
Hyaline Cast: NONE SEEN /LPF
Ketones, ur: NEGATIVE
Leukocyte Esterase: NEGATIVE
Nitrites, Initial: NEGATIVE
Protein, ur: NEGATIVE
Specific Gravity, Urine: 1.025 (ref 1.001–1.035)
pH: 6 (ref 5.0–8.0)

## 2021-05-03 LAB — URINE CULTURE
MICRO NUMBER:: 12475484
Result:: NO GROWTH
SPECIMEN QUALITY:: ADEQUATE

## 2021-05-03 LAB — CULTURE INDICATED

## 2021-05-04 ENCOUNTER — Telehealth: Payer: Self-pay

## 2021-05-04 NOTE — Telephone Encounter (Signed)
Left message to call.

## 2021-05-04 NOTE — Telephone Encounter (Signed)
I would recommend that she f/u with primary care for her cough and her back pain. Her back pain is not from a kidney infection.

## 2021-05-04 NOTE — Telephone Encounter (Signed)
Spoke with patient and informed her to follow up with PCP. Recheck on u/a scheduled 05/22/21 at 8:40am.

## 2021-05-04 NOTE — Telephone Encounter (Signed)
Patient called to check on urine culture results. Informed result is negative and Dr. Oscar La recommended repeat u/a if culture negative.  Patient also reports that x 2 days sore throat with productive cough. Neg home Covid test.  She continues with some slight tenderness in the right side of her back. Temp not higher than 99.2 (also sick with respiratory illness) and the frequency has greatly improved since Thursday.  I told her we will check with Dr. Oscar La for her recommendation.

## 2021-05-22 ENCOUNTER — Other Ambulatory Visit: Payer: Managed Care, Other (non HMO)

## 2021-05-25 ENCOUNTER — Other Ambulatory Visit: Payer: Self-pay | Admitting: *Deleted

## 2021-05-25 ENCOUNTER — Other Ambulatory Visit: Payer: Self-pay

## 2021-05-25 ENCOUNTER — Other Ambulatory Visit: Payer: Managed Care, Other (non HMO)

## 2021-05-25 DIAGNOSIS — R350 Frequency of micturition: Secondary | ICD-10-CM

## 2021-05-26 LAB — URINALYSIS, COMPLETE W/RFL CULTURE
Bacteria, UA: NONE SEEN /HPF
Bilirubin Urine: NEGATIVE
Glucose, UA: NEGATIVE
Hyaline Cast: NONE SEEN /LPF
Ketones, ur: NEGATIVE
Leukocyte Esterase: NEGATIVE
Nitrites, Initial: NEGATIVE
Protein, ur: NEGATIVE
Specific Gravity, Urine: 1.023 (ref 1.001–1.035)
WBC, UA: NONE SEEN /HPF (ref 0–5)
pH: <=5 (ref 5.0–8.0)

## 2021-05-26 LAB — URINE CULTURE

## 2021-05-26 LAB — CULTURE INDICATED

## 2021-05-26 NOTE — Progress Notes (Signed)
Spoke with [place] in lab to cancel to urine culture.

## 2021-05-27 ENCOUNTER — Telehealth: Payer: Self-pay | Admitting: *Deleted

## 2021-05-27 NOTE — Telephone Encounter (Signed)
-----   Message from Romualdo Bolk, MD sent at 05/26/2021  1:34 PM EDT ----- Please cancel the urine culture, she was just supposed to have the UA for hematuria.  Please confirm with the patient that she isn't having any vaginal bleeding to explain the blood. If not, please place a referral to Urology for microscopic hematuria.

## 2021-05-27 NOTE — Telephone Encounter (Signed)
Patient is not having vaginal bleeding, aware of referral office notes faxed to [facility] they will call to schedule.

## 2021-06-09 NOTE — Telephone Encounter (Signed)
Patient scheduled on 07/10/21

## 2021-09-09 ENCOUNTER — Encounter (HOSPITAL_BASED_OUTPATIENT_CLINIC_OR_DEPARTMENT_OTHER): Payer: Self-pay | Admitting: *Deleted

## 2021-10-06 ENCOUNTER — Ambulatory Visit (HOSPITAL_BASED_OUTPATIENT_CLINIC_OR_DEPARTMENT_OTHER): Payer: Managed Care, Other (non HMO) | Admitting: Family Medicine

## 2021-10-07 ENCOUNTER — Telehealth (HOSPITAL_BASED_OUTPATIENT_CLINIC_OR_DEPARTMENT_OTHER): Payer: Self-pay | Admitting: Family Medicine

## 2021-10-28 NOTE — Telephone Encounter (Signed)
LVM to rechedule missed NP appt ?

## 2021-11-02 ENCOUNTER — Ambulatory Visit (HOSPITAL_BASED_OUTPATIENT_CLINIC_OR_DEPARTMENT_OTHER): Payer: Managed Care, Other (non HMO) | Admitting: Family Medicine

## 2021-11-02 ENCOUNTER — Encounter (HOSPITAL_BASED_OUTPATIENT_CLINIC_OR_DEPARTMENT_OTHER): Payer: Self-pay | Admitting: Family Medicine

## 2021-11-02 VITALS — BP 134/88 | HR 74 | Temp 97.7°F | Ht 66.14 in | Wt 178.0 lb

## 2021-11-02 DIAGNOSIS — K869 Disease of pancreas, unspecified: Secondary | ICD-10-CM | POA: Insufficient documentation

## 2021-11-02 DIAGNOSIS — Z0000 Encounter for general adult medical examination without abnormal findings: Secondary | ICD-10-CM

## 2021-11-02 DIAGNOSIS — Z8632 Personal history of gestational diabetes: Secondary | ICD-10-CM

## 2021-11-02 DIAGNOSIS — K760 Fatty (change of) liver, not elsewhere classified: Secondary | ICD-10-CM | POA: Diagnosis not present

## 2021-11-02 NOTE — Progress Notes (Signed)
? ?New Patient Office Visit ? ?Subjective:  ?Patient ID: Erika Tran, female    DOB: 01/11/1966  Age: 56 y.o. MRN: [id number] ? ?CC: Establish care, questions about recent abdominal imaging ? ?HPI ?Erika Tran is a 56 year old female presenting to establish clinic.  She has not had a recent primary care provider, has concerns as outlined above. ? ?Patient was having evaluation for microscopic hematuria recently and as part of evaluation she had a CT scan of the the abdomen and pelvis completed.  Reviewed this imaging which is available within her chart, cannot directly observe images, was able to review imaging report with patient.  Primary findings on report included mildly enlarged heart, fatty liver, pancreatic mass/cyst. ? ?She was also referred to [facility] urology for evaluation of microscopic hematuria.  Indicates that she did establish with [facility], reports that they did just want to monitor for now - her visit with them was in February. Unsure of when next follow-up is. ? ?She does report family history of heart disease in her father ?She also has a history of gestational diabetes in the prior pregnancy. ? ?Patient is a Research scientist (physical sciences), she considers [place] home, has been living here since 1986. Patient is self-employed, she cleans houses. Outside of work, patient loves to do yard work. Did recently travel to [place] and did some hiking. ? ?Past Medical History:  ?Diagnosis Date  ? LGSIL (low grade squamous intraepithelial dysplasia) 2001  ? NORMAL PAPS AFTER  ? ? ?Past Surgical History:  ?Procedure Laterality Date  ? INTRAUTERINE DEVICE INSERTION  08/29/2017  ? Mirena  ? ? ?Family History  ?Problem Relation Age of Onset  ? Osteoporosis Mother   ? Hypertension Father   ? Osteoporosis Maternal Grandmother   ? Diabetes Maternal Grandfather   ? Breast cancer Paternal Grandmother   ? Colon polyps Neg Hx   ? Colon cancer Neg Hx   ? Esophageal cancer Neg Hx   ? Rectal cancer Neg Hx   ? Stomach cancer  Neg Hx   ? ? ?Social History  ? ?Socioeconomic History  ? Marital status: Married  ?  Spouse name: Fredrik Cove  ? Number of children: 4  ? Years of education: Not on file  ? Highest education level: Not on file  ?Occupational History  ? Not on file  ?Tobacco Use  ? Smoking status: Never  ? Smokeless tobacco: Never  ?Vaping Use  ? Vaping Use: Never used  ?Substance and Sexual Activity  ? Alcohol use: Yes  ?  Comment: Rare  ? Drug use: No  ? Sexual activity: Yes  ?  Partners: Male  ?  Birth control/protection: I.U.D.  ?  Comment: Mirena 08/29/2017  ?Other Topics Concern  ? Not on file  ?Social History Narrative  ? Not on file  ? ?Social Determinants of Health  ? ?Financial Resource Strain: Not on file  ?Food Insecurity: Not on file  ?Transportation Needs: Not on file  ?Physical Activity: Not on file  ?Stress: Not on file  ?Social Connections: Not on file  ?Intimate Partner Violence: Not on file  ? ? ?Objective:  ? ?Today's Vitals: BP 134/88   Pulse 74   Temp 97.7 ?F (36.5 ?C)   Ht 5' 6.14" (1.68 m)   Wt 178 lb (80.7 kg)   SpO2 99%   BMI 28.61 kg/m?  ? ?Physical Exam ? ?56 year old female in no acute distress ?Cardiovascular exam with regular rate and rhythm, no murmur appreciated ?Lungs clear to  auscultation bilaterally ? ?Assessment & Plan:  ? ?Problem List Items Addressed This Visit   ? ?  ? Digestive  ? Hepatic steatosis  ?  Observed on recent CT scan.  Discussed these findings with patient as well as primary treatment ?Recommend lifestyle modifications, discussed diet and exercise and working towards least 5 to 10% weight loss ?Provided handout covering this issue ?Recommend avoidance of alcohol ?  ?  ? Lesion of pancreas  ?  Observed on recent CT scan, recommendations are for annual follow-up with CT of the abdomen with contrast for the next 5 years ?Discussed this with patient, next imaging will need to be around February 2024 ?  ?  ?  ? Other  ? Hx gestational diabetes  ?  Discussed that this does place her at  increased risk of developing diabetes in the future.  Primary recommendations are related to lifestyle modifications in order to decrease this risk.  Prior fasting labs have not shown any elevation in blood sugar ?Plan to check A1c with upcoming wellness visit labs ?  ?  ? ?Other Visit Diagnoses   ? ? Wellness examination    -  Primary  ? Relevant Orders  ? CBC with Differential/Platelet  ? Comprehensive metabolic panel  ? Hemoglobin A1c  ? Lipid panel  ? TSH Rfx on Abnormal to Free T4  ? ?  ? ? ?Outpatient Encounter Medications as of 11/02/2021  ?Medication Sig  ? Ascorbic Acid (VITA-C PO) Take by mouth.  ? Cyanocobalamin (VITAMIN B-12 PO) Take 1 tablet by mouth daily.  ? MAGNESIUM PO Take by mouth.  ? Omega-3 Fatty Acids (FISH OIL PO) Take 1 tablet by mouth daily.  ? VITAMIN D PO Take by mouth.  ? ?Facility-Administered Encounter Medications as of 11/02/2021  ?Medication  ? levonorgestrel (MIRENA) 20 MCG/24HR IUD  ? ? ?Follow-up: No follow-ups on file.  Plan for follow-up in about 2 to 3 months to complete CPE, labs 1 week prior ? ?Alexei Doswell J De [place], MD ? ?

## 2021-11-02 NOTE — Assessment & Plan Note (Signed)
Observed on recent CT scan, recommendations are for annual follow-up with CT of the abdomen with contrast for the next 5 years ?Discussed this with patient, next imaging will need to be around February 2024 ?

## 2021-11-02 NOTE — Assessment & Plan Note (Signed)
Discussed that this does place her at increased risk of developing diabetes in the future.  Primary recommendations are related to lifestyle modifications in order to decrease this risk.  Prior fasting labs have not shown any elevation in blood sugar ?Plan to check A1c with upcoming wellness visit labs ?

## 2021-11-02 NOTE — Assessment & Plan Note (Signed)
Observed on recent CT scan.  Discussed these findings with patient as well as primary treatment ?Recommend lifestyle modifications, discussed diet and exercise and working towards least 5 to 10% weight loss ?Provided handout covering this issue ?Recommend avoidance of alcohol ?

## 2021-11-12 ENCOUNTER — Telehealth (HOSPITAL_BASED_OUTPATIENT_CLINIC_OR_DEPARTMENT_OTHER): Payer: Self-pay | Admitting: Family Medicine

## 2021-11-12 NOTE — Telephone Encounter (Signed)
Pt called scheduling vm regarding a possible broken or cracked rib. Provider did not have any availability today or tomorrow advised pt to go to ED or Urgent care to get this looked at. Pt understood. ?

## 2021-12-08 NOTE — Progress Notes (Signed)
56 y.o. [id number] Married White or Caucasian Hispanic or Latino female here for annual exam.  She has a mirena IUD, inserted in 3/19. No vaginal bleeding. She is having vasomotor symptoms. She is having 1-2 hot flashes a day. A night she gets warm, not waking up sweating. No vaginal dryness, no dyspareunia.   FSH in 6/22 was 105.8  She had an abdomen and pelvis CT in Feb for microscopic hematuria. Negative evaluation.   She c/o mixed incontinence, urge > stress. Getting worse.     No LMP recorded. (Menstrual status: IUD).          Sexually active: Yes.    The current method of family planning is IUD.    Exercising: No.  The patient has a physically strenuous job, but has no regular exercise apart from work.  Smoker:  no  Health Maintenance: Pap:  09/25/19 WNL Hr HPV Neg History of abnormal Pap:  yes LSIL in 2001 MMG:  03/13/21 Bi-rads 2 benign ( Care everywhere)  BMD:   none  Colonoscopy: 04/27/21 F/u 10 years  TDaP:  unsure  Gardasil: n/a   reports that she has never smoked. She has never used smokeless tobacco. She reports current alcohol use. She reports that she does not use drugs. She has her own housecleaning business. 4 kids, 3 girls, one boy. Oldest daughter is married, no kids. Everyone is local.   Past Medical History:  Diagnosis Date   LGSIL (low grade squamous intraepithelial dysplasia) 2001   NORMAL PAPS AFTER    Past Surgical History:  Procedure Laterality Date   INTRAUTERINE DEVICE INSERTION  08/29/2017   Mirena    Current Outpatient Medications  Medication Sig Dispense Refill   Ascorbic Acid (VITA-C PO) Take by mouth.     Cyanocobalamin (VITAMIN B-12 PO) Take 1 tablet by mouth daily.     MAGNESIUM PO Take by mouth.     Omega-3 Fatty Acids (FISH OIL PO) Take 1 tablet by mouth daily.     VITAMIN D PO Take by mouth.     Current Facility-Administered Medications  Medication Dose Route Frequency Provider Last Rate Last Admin   levonorgestrel (MIRENA) 20 MCG/24HR  IUD   Intrauterine Once Fontaine, Nadyne Coombes, MD        Family History  Problem Relation Age of Onset   Osteoporosis Mother    Hypertension Father    Osteoporosis Maternal Grandmother    Diabetes Maternal Grandfather    Breast cancer Paternal Grandmother    Colon polyps Neg Hx    Colon cancer Neg Hx    Esophageal cancer Neg Hx    Rectal cancer Neg Hx    Stomach cancer Neg Hx     Review of Systems  All other systems reviewed and are negative.  Exam:   BP 122/82   Pulse 65   Ht 5\' 7"  (1.702 m)   Wt 175 lb (79.4 kg)   SpO2 99%   BMI 27.41 kg/m   Weight change: @WEIGHTCHANGE @ Height:   Height: 5\' 7"  (170.2 cm)  Ht Readings from Last 3 Encounters:  12/17/21 5\' 7"  (1.702 m)  11/02/21 5' 6.14" (1.68 m)  04/27/21 5\' 5"  (1.651 m)    General appearance: alert, cooperative and appears stated age Head: Normocephalic, without obvious abnormality, atraumatic Neck: no adenopathy, supple, symmetrical, trachea midline and thyroid normal to inspection and palpation Lungs: clear to auscultation bilaterally Cardiovascular: regular rate and rhythm Breasts: normal appearance, no masses or tenderness Abdomen: soft, non-tender; non distended,  no masses,  no organomegaly Extremities: extremities normal, atraumatic, no cyanosis or edema Skin: Skin color, texture, turgor normal. No rashes or lesions Lymph nodes: Cervical, supraclavicular, and axillary nodes normal. No abnormal inguinal nodes palpated Neurologic: Grossly normal   Pelvic: External genitalia:  no lesions              Urethra:  normal appearing urethra with no masses, tenderness or lesions              Bartholins and Skenes: normal                 Vagina: normal appearing vagina with normal color and discharge, no lesions              Cervix: no lesions and IUD strings 2-3 cm               Bimanual Exam:  Uterus:  normal size, contour, position, consistency, mobility, non-tender              Adnexa: no mass, fullness,  tenderness               Rectovaginal: Confirms               Anus:  normal sphincter tone, no lesions  Carolynn Serve chaperoned for the exam.   1. Well woman exam Discussed breast self exam Discussed calcium and vit D intake Mammogram and colonoscopy UTD No pap this year Labs ordered from primary  2. Mixed incontinence Urge>Stress - Urinalysis,Complete w/RFL Culture -Kegel information given -Discussed option of PT and medication, she will reach out if she wants to start  3. Diet controlled gestational diabetes mellitus (GDM), antepartum Aware of risk of developing DM, has labs ordered by primary  She is going to f/u with her primary to further discuss her CT from 2/23

## 2021-12-17 ENCOUNTER — Encounter: Payer: Self-pay | Admitting: Obstetrics and Gynecology

## 2021-12-17 ENCOUNTER — Ambulatory Visit (INDEPENDENT_AMBULATORY_CARE_PROVIDER_SITE_OTHER): Payer: Managed Care, Other (non HMO) | Admitting: Obstetrics and Gynecology

## 2021-12-17 VITALS — BP 122/82 | HR 65 | Ht 67.0 in | Wt 175.0 lb

## 2021-12-17 DIAGNOSIS — N3946 Mixed incontinence: Secondary | ICD-10-CM

## 2021-12-17 DIAGNOSIS — O24410 Gestational diabetes mellitus in pregnancy, diet controlled: Secondary | ICD-10-CM | POA: Diagnosis not present

## 2021-12-17 DIAGNOSIS — Z01419 Encounter for gynecological examination (general) (routine) without abnormal findings: Secondary | ICD-10-CM

## 2021-12-17 DIAGNOSIS — O24419 Gestational diabetes mellitus in pregnancy, unspecified control: Secondary | ICD-10-CM | POA: Insufficient documentation

## 2021-12-17 NOTE — Patient Instructions (Addendum)
EXERCISE   We recommended that you start or continue a regular exercise program for good health. Physical activity is anything that gets your body moving, some is better than none. The CDC recommends 150 minutes per week of Moderate-Intensity Aerobic Activity and 2 or more days of Muscle Strengthening Activity.  Benefits of exercise are limitless: helps weight loss/weight maintenance, improves mood and energy, helps with depression and anxiety, improves sleep, tones and strengthens muscles, improves balance, improves bone density, protects from chronic conditions such as heart disease, high blood pressure and diabetes and so much more. To learn more visit: https://www.cdc.gov/physicalactivity/index.html  DIET: Good nutrition starts with a healthy diet of fruits, vegetables, whole grains, and lean protein sources. Drink plenty of water for hydration. Minimize empty calories, sodium, sweets. For more information about dietary recommendations visit: https://health.gov/our-work/nutrition-physical-activity/dietary-guidelines and https://www.myplate.gov/  ALCOHOL:  Women should limit their alcohol intake to no more than 7 drinks/beers/glasses of wine (combined, not each!) per week. Moderation of alcohol intake to this level decreases your risk of breast cancer and liver damage.  If you are concerned that you may have a problem, or your friends have told you they are concerned about your drinking, there are many resources to help. A well-known program that is free, effective, and available to all people all over the nation is Alcoholics Anonymous.  Check out this site to learn more: https://www.aa.org/   CALCIUM AND VITAMIN D:  Adequate intake of calcium and Vitamin D are recommended for bone health.  You should be getting between 1000-1200 mg of calcium and 800 units of Vitamin D daily between diet and supplements  PAP SMEARS:  Pap smears, to check for cervical cancer or precancers,  have traditionally been  done yearly, scientific advances have shown that most women can have pap smears less often.  However, every woman still should have a physical exam from her gynecologist every year. It will include a breast check, inspection of the vulva and vagina to check for abnormal growths or skin changes, a visual exam of the cervix, and then an exam to evaluate the size and shape of the uterus and ovaries. We will also provide age appropriate advice regarding health maintenance, like when you should have certain vaccines, screening for sexually transmitted diseases, bone density testing, colonoscopy, mammograms, etc.   MAMMOGRAMS:  All women over 40 years old should have a routine mammogram.   COLON CANCER SCREENING: Now recommend starting at age 45. At this time colonoscopy is not covered for routine screening until 50. There are take home tests that can be done between 45-49.   COLONOSCOPY:  Colonoscopy to screen for colon cancer is recommended for all women at age 50.  We know, you hate the idea of the prep.  We agree, BUT, having colon cancer and not knowing it is worse!!  Colon cancer so often starts as a polyp that can be seen and removed at colonscopy, which can quite literally save your life!  And if your first colonoscopy is normal and you have no family history of colon cancer, most women don't have to have it again for 10 years.  Once every ten years, you can do something that may end up saving your life, right?  We will be happy to help you get it scheduled when you are ready.  Be sure to check your insurance coverage so you understand how much it will cost.  It may be covered as a preventative service at no cost, but you should check   your particular policy.      Breast Self-Awareness Breast self-awareness means being familiar with how your breasts look and feel. It involves checking your breasts regularly and reporting any changes to your health care provider. Practicing breast self-awareness is  important. A change in your breasts can be a sign of a serious medical problem. Being familiar with how your breasts look and feel allows you to find any problems early, when treatment is more likely to be successful. All women should practice breast self-awareness, including women who have had breast implants. How to do a breast self-exam One way to learn what is normal for your breasts and whether your breasts are changing is to do a breast self-exam. To do a breast self-exam: Look for Changes  Remove all the clothing above your waist. Stand in front of a mirror in a room with good lighting. Put your hands on your hips. Push your hands firmly downward. Compare your breasts in the mirror. Look for differences between them (asymmetry), such as: Differences in shape. Differences in size. Puckers, dips, and bumps in one breast and not the other. Look at each breast for changes in your skin, such as: Redness. Scaly areas. Look for changes in your nipples, such as: Discharge. Bleeding. Dimpling. Redness. A change in position. Feel for Changes Carefully feel your breasts for lumps and changes. It is best to do this while lying on your back on the floor and again while sitting or standing in the shower or tub with soapy water on your skin. Feel each breast in the following way: Place the arm on the side of the breast you are examining above your head. Feel your breast with the other hand. Start in the nipple area and make  inch (2 cm) overlapping circles to feel your breast. Use the pads of your three middle fingers to do this. Apply light pressure, then medium pressure, then firm pressure. The light pressure will allow you to feel the tissue closest to the skin. The medium pressure will allow you to feel the tissue that is a little deeper. The firm pressure will allow you to feel the tissue close to the ribs. Continue the overlapping circles, moving downward over the breast until you feel your  ribs below your breast. Move one finger-width toward the center of the body. Continue to use the  inch (2 cm) overlapping circles to feel your breast as you move slowly up toward your collarbone. Continue the up and down exam using all three pressures until you reach your armpit.  Write Down What You Find  Write down what is normal for each breast and any changes that you find. Keep a written record with breast changes or normal findings for each breast. By writing this information down, you do not need to depend only on memory for size, tenderness, or location. Write down where you are in your menstrual cycle, if you are still menstruating. If you are having trouble noticing differences in your breasts, do not get discouraged. With time you will become more familiar with the variations in your breasts and more comfortable with the exam. How often should I examine my breasts? Examine your breasts every month. If you are breastfeeding, the best time to examine your breasts is after a feeding or after using a breast pump. If you menstruate, the best time to examine your breasts is 5-7 days after your period is over. During your period, your breasts are lumpier, and it may be more   difficult to notice changes. When should I see my health care provider? See your health care provider if you notice: A change in shape or size of your breasts or nipples. A change in the skin of your breast or nipples, such as a reddened or scaly area. Unusual discharge from your nipples. A lump or thick area that was not there before. Pain in your breasts. Anything that concerns you. Kegel Exercises  Kegel exercises can help strengthen your pelvic floor muscles. The pelvic floor is a group of muscles that support your rectum, small intestine, and bladder. In females, pelvic floor muscles also help support the uterus. These muscles help you control the flow of urine and stool (feces). Kegel exercises are painless and  simple. They do not require any equipment. Your provider may suggest Kegel exercises to: Improve bladder and bowel control. Improve sexual response. Improve weak pelvic floor muscles after surgery to remove the uterus (hysterectomy) or after pregnancy, in females. Improve weak pelvic floor muscles after prostate gland removal or surgery, in males. Kegel exercises involve squeezing your pelvic floor muscles. These are the same muscles you squeeze when you try to stop the flow of urine or keep from passing gas. The exercises can be done while sitting, standing, or lying down, but it is best to vary your position. Ask your health care provider which exercises are safe for you. Do exercises exactly as told by your health care provider and adjust them as directed. Do not begin these exercises until told by your health care provider. Exercises How to do Kegel exercises: Squeeze your pelvic floor muscles tight. You should feel a tight lift in your rectal area. If you are a female, you should also feel a tightness in your vaginal area. Keep your stomach, buttocks, and legs relaxed. Hold the muscles tight for up to 10 seconds. Breathe normally. Relax your muscles for up to 10 seconds. Repeat as told by your health care provider. Repeat this exercise daily as told by your health care provider. Continue to do this exercise for at least 4-6 weeks, or for as long as told by your health care provider. You may be referred to a physical therapist who can help you learn more about how to do Kegel exercises. Depending on your condition, your health care provider may recommend: Varying how long you squeeze your muscles. Doing several sets of exercises every day. Doing exercises for several weeks. Making Kegel exercises a part of your regular exercise routine. This information is not intended to replace advice given to you by your health care provider. Make sure you discuss any questions you have with your health  care provider. Document Revised: 11/20/2020 Document Reviewed: 11/20/2020 Elsevier Patient Education  2023 [employer] Inc.  

## 2021-12-19 LAB — URINALYSIS, COMPLETE W/RFL CULTURE
Bacteria, UA: NONE SEEN /HPF
Bilirubin Urine: NEGATIVE
Casts: NONE SEEN /LPF
Crystals: NONE SEEN /HPF
Glucose, UA: NEGATIVE
Hyaline Cast: NONE SEEN /LPF
Ketones, ur: NEGATIVE
Leukocyte Esterase: NEGATIVE
Nitrites, Initial: NEGATIVE
Protein, ur: NEGATIVE
Specific Gravity, Urine: 1.02 (ref 1.001–1.035)
WBC, UA: NONE SEEN /HPF (ref 0–5)
Yeast: NONE SEEN /HPF
pH: 7 (ref 5.0–8.0)

## 2021-12-19 LAB — URINE CULTURE
MICRO NUMBER:: 13444941
Result:: NO GROWTH
SPECIMEN QUALITY:: ADEQUATE

## 2021-12-19 LAB — CULTURE INDICATED

## 2022-09-23 ENCOUNTER — Telehealth: Payer: Self-pay

## 2022-09-23 NOTE — Telephone Encounter (Signed)
I think Len Blalock, PA is taking new patients. I'm not aware of any female MD's taking new patients, but Inocente Salles is very good.

## 2022-09-23 NOTE — Telephone Encounter (Signed)
Pt notified via detailed msg per DPR. Will close encounter.

## 2022-09-23 NOTE — Telephone Encounter (Signed)
Pt calling to inquire if you have a specific female PCP that you could refer her to or recommend to her? Please advise.   FYI. She is scheduled to see you on 12/22/2022.

## 2022-10-11 ENCOUNTER — Ambulatory Visit (INDEPENDENT_AMBULATORY_CARE_PROVIDER_SITE_OTHER): Payer: Managed Care, Other (non HMO) | Admitting: Family Medicine

## 2022-10-11 ENCOUNTER — Encounter (HOSPITAL_BASED_OUTPATIENT_CLINIC_OR_DEPARTMENT_OTHER): Payer: Self-pay | Admitting: Family Medicine

## 2022-10-11 VITALS — BP 143/85 | HR 63 | Ht 66.5 in | Wt 181.8 lb

## 2022-10-11 DIAGNOSIS — Z0000 Encounter for general adult medical examination without abnormal findings: Secondary | ICD-10-CM | POA: Diagnosis not present

## 2022-10-11 DIAGNOSIS — R0602 Shortness of breath: Secondary | ICD-10-CM | POA: Diagnosis not present

## 2022-10-11 DIAGNOSIS — K869 Disease of pancreas, unspecified: Secondary | ICD-10-CM | POA: Diagnosis not present

## 2022-10-11 DIAGNOSIS — Z23 Encounter for immunization: Secondary | ICD-10-CM

## 2022-10-11 HISTORY — DX: Shortness of breath: R06.02

## 2022-10-11 NOTE — Assessment & Plan Note (Addendum)
Routine HCM labs ordered- patient fasting today. Will obtain CBC, CMP, lipid panel, TSH and A1C.  Recommend healthy diet. DASH diet discussed and handout provided.  Recommend approximately 150 minutes/week of moderate intensity exercise, 30 minute sessions 3-5 days per week.  Recommend regular dental and vision exams. Always use seatbelt/lap and shoulder restraints. Recommend using smoke alarms and checking batteries at least twice a year. Recommend using sunscreen when outside. Discussed immunization recommendations for shingles and tetanus vaccine. Patient agreed to proceed with Tdap today. Will consider Shingrix vaccine in future.   Discussed colon cancer screening recommendations.  Patient UTD, due 2032.   Breast cancer screening recommendations reviewed. Patient due for mammogram in Sept 2024.  Due for Pap smear. Sees GYN. Report has an appt in May 2024.

## 2022-10-11 NOTE — Assessment & Plan Note (Addendum)
Patient reports feeling short of breath when exerting herself. She noticed this last year (2023) when she was hiking in [place]. Denies chest pain/pressure, fainting, nausea/vomiting, abdominal pain, audible wheezing, cough, orthopnea, claudication. No acute distress during physical exam. Normal oxygen saturation in office. Lungs clear to auscultation. Initial testing will include CBC (assess for anemia), CMP (assess for any metabolic causes), and TSH (hypo- or hyperthyroidism). Advised patient to monitor pulse oximetry and heart rate at home with finger probe that can be purchased at pharmacy. Educated patient about de-conditioning and recommended increasing physical activity as tolerated, 30 minutes for 3-5 days per week. With shared decision making, will proceed with blood work. Based on findings, will discuss obtaining CXR. Pending results of CXR, pulmonary function testing and cardiac testing would be reasonable for further work-up to determine cause of her exertional dyspnea.   Educated patient about acute symptoms of shortness of breath and when to seek emergency care.

## 2022-10-11 NOTE — Progress Notes (Signed)
Complete physical exam  Patient: Erika Tran   DOB: [birth year]   57 y.o. Female  MRN: [id number]  Subjective:     Erika Tran is a 57 y.o. female who presents today for a complete physical exam. She reports consuming a general diet. The patient does not participate in regular exercise at present. She generally feels fairly well. Reports feeling more fatigued. She reports sleeping poorly. Reports feeling hot and cold at night- has been ongoing for the past 4-5 months. Last month has been more frequent nighttime awakenings. Vasomotor symptoms are not bothersome. She does have additional problems to discuss today.   She reports having issues with shortness of breath with exertion. Last year, she went to [place] and noticed while she was hiking that she experienced shortness of breath during this activity. This past weekend, she noticed she was walking up hill and she felt it was "difficult to catch breath." She felt like she was "breathing heavily." Denies chest pain, shortness of breath at rest, inability to recover from the "episode," hx of asthma/COPD, audible wheezing, and cough. 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    Hx of hematuria: Urology- cleared from urology  Reports she still has hematuria and urology said this is "normal" for her.  Reports they found something on her pancreas. Denies N/V, abd pain.  Based on previous notes from April 2023, needs repeat CT scan with contrast   Sees GYN- May 2024 due for Pap    Most recent fall risk assessment:    11/02/2021    2:48 PM  [place] in the past year? 0  Number falls in past yr: 0  Injury with Fall? 0  Risk for fall due to : No Fall Risks  Follow up Falls evaluation completed     Most recent depression screenings:    11/02/2021    2:48 PM  PHQ 2/9 Scores  PHQ - 2 Score 0    Dentist- has appt this month  Ophthalmologist- previous week in March 2024  (had R eye floaters assessed)     Outpatient Medications Prior to Visit  Medication Sig   Cyanocobalamin (VITAMIN B-12 PO) Take 1 tablet by mouth daily.   Omega-3 Fatty Acids (FISH OIL PO) Take 1 tablet by mouth daily.   VITAMIN D PO Take by mouth.   Ascorbic Acid (VITA-C PO) Take by mouth. (Patient not taking: Reported on 10/11/2022)   MAGNESIUM PO Take by mouth. (Patient not taking: Reported on 10/11/2022)   Facility-Administered Medications Prior to Visit  Medication Dose Route Frequency Provider   levonorgestrel (MIRENA) 20 MCG/24HR IUD   Intrauterine Once Fontaine, Belinda Block, MD    Review of Systems  Constitutional:  Negative for chills and fever.  HENT:  Negative for ear pain and hearing loss.   Eyes:  Negative for blurred vision and double vision.  Respiratory:  Negative for cough and shortness of breath.   Cardiovascular:  Negative for chest pain and palpitations.  Gastrointestinal:  Negative for abdominal pain, nausea and vomiting.  Genitourinary:  Positive for hematuria. Negative for frequency.  Musculoskeletal:  Negative for myalgias.  Neurological:  Negative for dizziness and weakness.      Objective:     BP (!) 143/85   Pulse 63   Ht 5' 6.5" (1.689 m)   Wt 181 lb 12.8 oz (82.5 kg)   SpO2 100%   BMI  28.90 kg/m    Physical Exam Constitutional:      Appearance: Normal appearance.  HENT:     Right Ear: Tympanic membrane and ear canal normal.     Left Ear: There is impacted cerumen.     Nose: Nose normal.     Mouth/Throat:     Mouth: Mucous membranes are moist.     Pharynx: Oropharynx is clear.  Eyes:     Extraocular Movements: Extraocular movements intact.     Conjunctiva/sclera: Conjunctivae normal.     Pupils: Pupils are equal, round, and reactive to light.  Cardiovascular:     Rate and Rhythm: Normal rate and regular rhythm.     Pulses: Normal pulses.     Heart sounds: Normal heart sounds.  Pulmonary:     Effort: Pulmonary effort is normal.      Breath sounds: Normal breath sounds.  Abdominal:     General: Abdomen is flat. Bowel sounds are normal.     Palpations: Abdomen is soft.     Tenderness: There is no abdominal tenderness. There is no right CVA tenderness or left CVA tenderness.  Musculoskeletal:        General: Normal range of motion.  Skin:    General: Skin is warm and dry.  Neurological:     Mental Status: She is alert.  Psychiatric:        Mood and Affect: Mood normal.        Behavior: Behavior normal.        Thought Content: Thought content normal.        Judgment: Judgment normal.       Assessment & Plan:    Routine Health Maintenance and Physical Exam  Immunization History  Administered Date(s) Administered   PFIZER(Purple Top)SARS-COV-2 Vaccination 11/10/2019, 12/08/2019   Tdap 10/11/2022    Health Maintenance  Topic Date Due   HIV Screening  Never done   Hepatitis C Screening  Never done   Zoster Vaccines- Shingrix (1 of 2) Never done   INFLUENZA VACCINE  Never done   COVID-19 Vaccine (3 - 2023-24 season) 03/26/2022   PAP SMEAR-Modifier  09/25/2022   MAMMOGRAM  03/14/2023   COLONOSCOPY (Pts 45-86yrs Insurance coverage will need to be confirmed)  04/28/2031   DTaP/Tdap/Td (2 - Td or Tdap) 10/10/2032   HPV VACCINES  Aged Out    Discussed health benefits of physical activity, and encouraged her to engage in regular exercise appropriate for her age and condition.  Problem List Items Addressed This Visit       Digestive   Lesion of pancreas    Reviewed notes from previous office visits and results from previous imaging. Observed 55mm low-density pancreatic lesion on recent CT scan. Recommendations are for annual follow-up with CT of the abdomen with contrast for the next 5 years. Discussed this with patient, CT order with contrast placed. Will need imaging in February 2025.       Relevant Orders   CT Abdomen Pelvis W Contrast     Other   Wellness examination - Primary    Routine HCM labs  ordered- patient fasting today. Will obtain CBC, CMP, lipid panel, TSH and A1C.  Recommend healthy diet. DASH diet discussed and handout provided.  Recommend approximately 150 minutes/week of moderate intensity exercise, 30 minute sessions 3-5 days per week.  Recommend regular dental and vision exams. Always use seatbelt/lap and shoulder restraints. Recommend using smoke alarms and checking batteries at least twice a year. Recommend using sunscreen when  outside. Discussed immunization recommendations for shingles and tetanus vaccine. Patient agreed to proceed with Tdap today. Will consider Shingrix vaccine in future.   Discussed colon cancer screening recommendations.  Patient UTD, due 2032.   Breast cancer screening recommendations reviewed. Patient due for mammogram in Sept 2024.  Due for Pap smear. Sees GYN. Report has an appt in May 2024.       Relevant Orders   CBC with Differential/Platelet   COMPLETE METABOLIC PANEL WITH GFR   Hemoglobin A1c   TSH Rfx on Abnormal to Free T4   Lipid panel   Chronic shortness of breath    Patient reports feeling short of breath when exerting herself. She noticed this last year (2023) when she was hiking in [place]. Denies chest pain/pressure, fainting, nausea/vomiting, abdominal pain, audible wheezing, cough, orthopnea, claudication. No acute distress during physical exam. Normal oxygen saturation in office. Lungs clear to auscultation. Initial testing will include CBC (assess for anemia), CMP (assess for any metabolic causes), and TSH (hypo- or hyperthyroidism). Advised patient to monitor pulse oximetry and heart rate at home with finger probe that can be purchased at pharmacy. Educated patient about de-conditioning and recommended increasing physical activity as tolerated, 30 minutes for 3-5 days per week. With shared decision making, will proceed with blood work. Based on findings, will discuss obtaining CXR. Pending results of CXR, pulmonary  function testing and cardiac testing would be reasonable for further work-up to determine cause of her exertional dyspnea.   Educated patient about acute symptoms of shortness of breath and when to seek emergency care.       Return in 1 year (on 10/11/2023) for CPE. Or sooner if shortness of breath becomes more progressive/worse     Les Pou, FNP

## 2022-10-11 NOTE — Assessment & Plan Note (Signed)
Reviewed notes from previous office visits and results from previous imaging. Observed 59mm low-density pancreatic lesion on recent CT scan. Recommendations are for annual follow-up with CT of the abdomen with contrast for the next 5 years. Discussed this with patient, CT order with contrast placed. Will need imaging in February 2025.

## 2022-10-12 LAB — HEMOGLOBIN A1C
Est. average glucose Bld gHb Est-mCnc: 111 mg/dL
Hgb A1c MFr Bld: 5.5 % (ref 4.8–5.6)

## 2022-10-12 LAB — COMPREHENSIVE METABOLIC PANEL
ALT: 18 IU/L (ref 0–32)
AST: 19 IU/L (ref 0–40)
Albumin/Globulin Ratio: 2 (ref 1.2–2.2)
Albumin: 4.5 g/dL (ref 3.8–4.9)
Alkaline Phosphatase: 103 IU/L (ref 44–121)
BUN/Creatinine Ratio: 25 — ABNORMAL HIGH (ref 9–23)
BUN: 16 mg/dL (ref 6–24)
Bilirubin Total: 0.5 mg/dL (ref 0.0–1.2)
CO2: 24 mmol/L (ref 20–29)
Calcium: 9.6 mg/dL (ref 8.7–10.2)
Chloride: 104 mmol/L (ref 96–106)
Creatinine, Ser: 0.64 mg/dL (ref 0.57–1.00)
Globulin, Total: 2.2 g/dL (ref 1.5–4.5)
Glucose: 88 mg/dL (ref 70–99)
Potassium: 4.4 mmol/L (ref 3.5–5.2)
Sodium: 141 mmol/L (ref 134–144)
Total Protein: 6.7 g/dL (ref 6.0–8.5)
eGFR: 105 mL/min/{1.73_m2} (ref >59)

## 2022-10-12 LAB — LIPID PANEL
Chol/HDL Ratio: 2.5 ratio (ref 0.0–4.4)
Cholesterol, Total: 137 mg/dL (ref 100–199)
HDL: 55 mg/dL (ref >39)
LDL Chol Calc (NIH): 71 mg/dL (ref 0–99)
Triglycerides: 50 mg/dL (ref 0–149)
VLDL Cholesterol Cal: 11 mg/dL (ref 5–40)

## 2022-10-12 LAB — CBC WITH DIFFERENTIAL/PLATELET
Basophils Absolute: 0 10*3/uL (ref 0.0–0.2)
Basos: 0 %
EOS (ABSOLUTE): 0.2 10*3/uL (ref 0.0–0.4)
Eos: 3 %
Hematocrit: 42.5 % (ref 34.0–46.6)
Hemoglobin: 13.9 g/dL (ref 11.1–15.9)
Immature Grans (Abs): 0 10*3/uL (ref 0.0–0.1)
Immature Granulocytes: 0 %
Lymphocytes Absolute: 2.2 10*3/uL (ref 0.7–3.1)
Lymphs: 32 %
MCH: 28.7 pg (ref 26.6–33.0)
MCHC: 32.7 g/dL (ref 31.5–35.7)
MCV: 88 fL (ref 79–97)
Monocytes Absolute: 0.4 10*3/uL (ref 0.1–0.9)
Monocytes: 6 %
Neutrophils Absolute: 4 10*3/uL (ref 1.4–7.0)
Neutrophils: 59 %
Platelets: 311 10*3/uL (ref 150–450)
RBC: 4.84 x10E6/uL (ref 3.77–5.28)
RDW: 12.4 % (ref 11.7–15.4)
WBC: 6.8 10*3/uL (ref 3.4–10.8)

## 2022-10-12 LAB — TSH RFX ON ABNORMAL TO FREE T4: TSH: 2.22 u[IU]/mL (ref 0.450–4.500)

## 2022-10-13 ENCOUNTER — Encounter (HOSPITAL_BASED_OUTPATIENT_CLINIC_OR_DEPARTMENT_OTHER): Payer: Self-pay | Admitting: Family Medicine

## 2022-11-12 ENCOUNTER — Other Ambulatory Visit: Payer: Managed Care, Other (non HMO)

## 2022-12-09 ENCOUNTER — Ambulatory Visit
Admission: RE | Admit: 2022-12-09 | Discharge: 2022-12-09 | Disposition: A | Discharge status: Home / Self Care | Payer: Managed Care, Other (non HMO) | Source: Ambulatory Visit | Attending: Family Medicine | Admitting: Family Medicine

## 2022-12-09 DIAGNOSIS — K869 Disease of pancreas, unspecified: Secondary | ICD-10-CM

## 2022-12-09 MED ORDER — IOPAMIDOL (ISOVUE-300) INJECTION 61%
80.0000 mL | Freq: Once | INTRAVENOUS | Status: AC | PRN
  Administered 2022-12-09: 80 mL via INTRAVENOUS

## 2022-12-15 NOTE — Progress Notes (Signed)
57 y.o. [id number] Married White or Caucasian Hispanic or Latino female here for annual exam.  She has a mirena IUD, inserted in 3/19. No vaginal bleeding. She has vasomotor symptoms, wax and wane. Some anxiety and stress. Doesn't feel depressed. C/O weight gain.  Sexually active, no pain, occasional mild dryness.   Mild urge incontinence, no change.     No LMP recorded. (Menstrual status: IUD).          Sexually active: Yes.    The current method of family planning is IUD.    Exercising: No.  The patient has a physically strenuous job, but has no regular exercise apart from work.  Smoker:  no  Health Maintenance: Pap:  09/25/19 WNL Hr HPV Neg History of abnormal Pap:  yes LSIL in 2001 MMG:  03/13/21 bi-rads 2 benign (Care everywhere)  BMD:   n/a Colonoscopy:04/27/21 F/u 10 years  TDaP:  10/11/22  Gardasil: n/a   reports that she has never smoked. She has never used smokeless tobacco. She reports current alcohol use. She reports that she does not use drugs. Just occasional ETOH. She has her own housecleaning business. 4 kids, 3 girls, one boy. Oldest daughter is married, no kids. Everyone is local.   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     Cyanocobalamin (VITAMIN B-12 PO) Take 1 tablet by mouth daily.     MAGNESIUM PO Take by mouth.     Omega-3 Fatty Acids (FISH OIL PO) Take 1 tablet by mouth daily.     VITAMIN D PO Take by mouth.     Current Facility-Administered Medications  Medication Dose Route Frequency  Provider Last Rate Last Admin   levonorgestrel (MIRENA) 20 MCG/24HR IUD   Intrauterine Once Fontaine, Nadyne Coombes, MD        Family History  Problem Relation Age of Onset   Osteoporosis Mother    Hypertension Father    Osteoporosis Maternal Grandmother    Diabetes Maternal Grandfather    Breast cancer Paternal Grandmother    Colon polyps Neg Hx    Colon cancer Neg Hx    Esophageal cancer Neg Hx    Rectal cancer Neg Hx    Stomach cancer Neg Hx     Review of Systems  All other systems reviewed and are negative.   Exam:   BP 110/70   Pulse 69   Ht 5\' 5"  (1.651 m)   Wt 183 lb 3.2 oz (83.1 kg)   SpO2 100%   BMI 30.49 kg/m   Weight change: @WEIGHTCHANGE @ Height:   Height: 5\' 5"  (165.1 cm)  Ht Readings from Last 3 Encounters:  12/22/22 5\' 5"  (1.651 m)  10/11/22 5' 6.5" (1.689 m)  12/17/21 5\' 7"  (1.702 m)    General appearance: alert, cooperative and appears stated age Head: Normocephalic, without obvious abnormality, atraumatic Neck: no adenopathy, supple, symmetrical, trachea midline and thyroid normal to inspection and palpation Lungs: clear to auscultation bilaterally Cardiovascular: regular rate and rhythm Breasts: normal appearance, no masses  or tenderness Abdomen: soft, non-tender; non distended,  no masses,  no organomegaly Extremities: extremities normal, atraumatic, no cyanosis or edema Skin: Skin color, texture, turgor normal. No rashes or lesions Lymph nodes: Cervical, supraclavicular, and axillary nodes normal. No abnormal inguinal nodes palpated Neurologic: Grossly normal   Pelvic: External genitalia:  no lesions              Urethra:  normal appearing urethra with no masses, tenderness or lesions              Bartholins and Skenes: normal                 Vagina: mildly atrophic appearing vagina with normal color and discharge, no lesions              Cervix: no lesions and IUD strings 3-4 cm               Bimanual Exam:  Uterus:  normal size, contour,  position, consistency, mobility, non-tender and anteverted              Adnexa: no mass, fullness, tenderness               Rectovaginal: Confirms               Anus:  normal sphincter tone, no lesions  Carolynn Serve, CMA chaperoned for the exam.  1. Well woman exam Discussed breast self exam Discussed calcium and vit D intake Mammogram due by 8/24 Colonoscopy UTD Labs with primary  2. Vasomotor symptoms due to menopause Discussed behavior changes, OTC agents - citalopram (CELEXA) 20 MG tablet; Take 1/2 a tablet a day for one week, if tolerating then increase to 1 tablet a day.  Dispense: 30 tablet; Refill: 1  3. Anxiety - citalopram (CELEXA) 20 MG tablet; Take 1/2 a tablet a day for one week, if tolerating then increase to 1 tablet a day.  Dispense: 30 tablet; Refill: 1 -F/U in one month

## 2022-12-16 ENCOUNTER — Other Ambulatory Visit (HOSPITAL_BASED_OUTPATIENT_CLINIC_OR_DEPARTMENT_OTHER): Payer: Self-pay

## 2022-12-16 DIAGNOSIS — K869 Disease of pancreas, unspecified: Secondary | ICD-10-CM

## 2022-12-16 DIAGNOSIS — R109 Unspecified abdominal pain: Secondary | ICD-10-CM

## 2022-12-22 ENCOUNTER — Encounter: Payer: Self-pay | Admitting: Obstetrics and Gynecology

## 2022-12-22 ENCOUNTER — Ambulatory Visit (INDEPENDENT_AMBULATORY_CARE_PROVIDER_SITE_OTHER): Payer: Managed Care, Other (non HMO) | Admitting: Obstetrics and Gynecology

## 2022-12-22 VITALS — BP 110/70 | HR 69 | Ht 65.0 in | Wt 183.2 lb

## 2022-12-22 DIAGNOSIS — F419 Anxiety disorder, unspecified: Secondary | ICD-10-CM

## 2022-12-22 DIAGNOSIS — N951 Menopausal and female climacteric states: Secondary | ICD-10-CM

## 2022-12-22 DIAGNOSIS — Z01419 Encounter for gynecological examination (general) (routine) without abnormal findings: Secondary | ICD-10-CM

## 2022-12-22 MED ORDER — CITALOPRAM HYDROBROMIDE 20 MG PO TABS
ORAL_TABLET | ORAL | 1 refills | Status: AC
Start: 2022-12-22 — End: ?

## 2022-12-22 NOTE — Patient Instructions (Signed)

## 2023-01-25 ENCOUNTER — Ambulatory Visit: Payer: Managed Care, Other (non HMO) | Admitting: Obstetrics and Gynecology

## 2023-03-29 ENCOUNTER — Ambulatory Visit: Payer: Managed Care, Other (non HMO) | Admitting: Podiatry

## 2023-06-16 ENCOUNTER — Encounter (HOSPITAL_BASED_OUTPATIENT_CLINIC_OR_DEPARTMENT_OTHER): Payer: Self-pay | Admitting: Family Medicine

## 2023-10-12 ENCOUNTER — Encounter (HOSPITAL_BASED_OUTPATIENT_CLINIC_OR_DEPARTMENT_OTHER): Payer: Managed Care, Other (non HMO) | Admitting: Family Medicine

## 2023-11-21 ENCOUNTER — Ambulatory Visit: Admitting: Nurse Practitioner

## 2024-01-10 ENCOUNTER — Ambulatory Visit (INDEPENDENT_AMBULATORY_CARE_PROVIDER_SITE_OTHER): Admitting: Obstetrics and Gynecology

## 2024-01-10 ENCOUNTER — Encounter: Payer: Self-pay | Admitting: Obstetrics and Gynecology

## 2024-01-10 VITALS — BP 124/78 | HR 57 | Temp 97.6°F | Ht 66.0 in | Wt 175.0 lb

## 2024-01-10 DIAGNOSIS — R102 Pelvic and perineal pain: Secondary | ICD-10-CM | POA: Diagnosis not present

## 2024-01-10 DIAGNOSIS — N951 Menopausal and female climacteric states: Secondary | ICD-10-CM | POA: Diagnosis not present

## 2024-01-10 DIAGNOSIS — R3129 Other microscopic hematuria: Secondary | ICD-10-CM

## 2024-01-10 DIAGNOSIS — Z1331 Encounter for screening for depression: Secondary | ICD-10-CM

## 2024-01-10 DIAGNOSIS — Z01419 Encounter for gynecological examination (general) (routine) without abnormal findings: Secondary | ICD-10-CM | POA: Diagnosis not present

## 2024-01-10 NOTE — Assessment & Plan Note (Signed)
 Cervical cancer screening performed according to ASCCP guidelines. Encouraged annual mammogram screening Colonoscopy UTD Labs and immunizations with her primary Encouraged safe sexual practices as indicated Encouraged healthy lifestyle practices with diet and exercise For patients under 50-56yo, I recommend 1200mg  calcium daily and 600IU of vitamin D daily.

## 2024-01-10 NOTE — Patient Instructions (Addendum)
 [facility] Address: [address] [place], [place], [place] [address] Phone: [phone]  For patients under 50-56yo, I recommend 1200mg  calcium daily and 600IU of vitamin D daily. For patients over 56yo, I recommend 1200mg  calcium daily and 800IU of vitamin D daily.  Health Maintenance, Female Adopting a healthy lifestyle and getting preventive care are important in promoting health and wellness. Ask your health care provider about: The right schedule for you to have regular tests and exams. Things you can do on your own to prevent diseases and keep yourself healthy. What should I know about diet, weight, and exercise? Eat a healthy diet  Eat a diet that includes plenty of vegetables, fruits, low-fat dairy products, and lean protein. Do not eat a lot of foods that are high in solid fats, added sugars, or sodium. Maintain a healthy weight Body mass index (BMI) is used to identify weight problems. It estimates body fat based on height and weight. Your health care provider can help determine your BMI and help you achieve or maintain a healthy weight. Get regular exercise Get regular exercise. This is one of the most important things you can do for your health. Most adults should: Exercise for at least 150 minutes each week. The exercise should increase your heart rate and make you sweat (moderate-intensity exercise). Do strengthening exercises at least twice a week. This is in addition to the moderate-intensity exercise. Spend less time sitting. Even light physical activity can be beneficial. Watch cholesterol and blood lipids Have your blood tested for lipids and cholesterol at 56 years of age, then have this test every 5 years. Have your cholesterol levels checked more often if: Your lipid or cholesterol levels are high. You are older than 56 years of age. You are at high risk for heart disease. What should I know about cancer screening? Depending on your health history and family  history, you may need to have cancer screening at various ages. This may include screening for: Breast cancer. Cervical cancer. Colorectal cancer. Skin cancer. Lung cancer. What should I know about heart disease, diabetes, and high blood pressure? Blood pressure and heart disease High blood pressure causes heart disease and increases the risk of stroke. This is more likely to develop in people who have high blood pressure readings or are overweight. Have your blood pressure checked: Every 3-5 years if you are 80-30 years of age. Every year if you are 56 years old or older. Diabetes Have regular diabetes screenings. This checks your fasting blood sugar level. Have the screening done: Once every three years after age 50 if you are at a normal weight and have a low risk for diabetes. More often and at a younger age if you are overweight or have a high risk for diabetes. What should I know about preventing infection? Hepatitis B If you have a higher risk for hepatitis B, you should be screened for this virus. Talk with your health care provider to find out if you are at risk for hepatitis B infection. Hepatitis C Testing is recommended for: Everyone born from [birth year] through 1963. Anyone with known risk factors for hepatitis C. Sexually transmitted infections (STIs) Get screened for STIs, including gonorrhea and chlamydia, if: You are sexually active and are younger than 56 years of age. You are older than 56 years of age and your health care provider tells you that you are at risk for this type of infection. Your sexual activity has changed since you were last screened,  and you are at increased risk for chlamydia or gonorrhea. Ask your health care provider if you are at risk. Ask your health care provider about whether you are at high risk for HIV. Your health care provider may recommend a prescription medicine to help prevent HIV infection. If you choose to take medicine to prevent HIV, you  should first get tested for HIV. You should then be tested every 3 months for as long as you are taking the medicine. Osteoporosis and menopause Osteoporosis is a disease in which the bones lose minerals and strength with aging. This can result in bone fractures. If you are 51 years old or older, or if you are at risk for osteoporosis and fractures, ask your health care provider if you should: Be screened for bone loss. Take a calcium or vitamin D supplement to lower your risk of fractures. Be given hormone replacement therapy (HRT) to treat symptoms of menopause. Follow these instructions at home: Alcohol use Do not drink alcohol if: Your health care provider tells you not to drink. You are pregnant, may be pregnant, or are planning to become pregnant. If you drink alcohol: Limit how much you have to: 0-1 drink a day. Know how much alcohol is in your drink. In the [place] one drink equals one 12 oz bottle of beer (355 mL), one 5 oz glass of wine (148 mL), or one 1 oz glass of hard liquor (44 mL). Lifestyle Do not use any products that contain nicotine or tobacco. These products include cigarettes, chewing tobacco, and vaping devices, such as e-cigarettes. If you need help quitting, ask your health care provider. Do not use street drugs. Do not share needles. Ask your health care provider for help if you need support or information about quitting drugs. General instructions Schedule regular health, dental, and eye exams. Stay current with your vaccines. Tell your health care provider if: You often feel depressed. You have ever been abused or do not feel safe at home. Summary Adopting a healthy lifestyle and getting preventive care are important in promoting health and wellness. Follow your health care provider's instructions about healthy diet, exercising, and getting tested or screened for diseases. Follow your health care provider's instructions on monitoring your cholesterol and blood  pressure. This information is not intended to replace advice given to you by your health care provider. Make sure you discuss any questions you have with your health care provider. Document Revised: 12/01/2020 Document Reviewed: 12/01/2020 Elsevier Patient Education  2024 [employer].

## 2024-01-10 NOTE — Progress Notes (Signed)
 58 y.o. [id number] female with Mirena  IUD (inserted 09/2017) here for annual exam. Married.  Housecleaning business.  No LMP recorded. (Menstrual status: IUD).   She reports swelling in right leg for a few days last week. No calf pain. Swelling resolved with elevation. She also noted occasional, mild lower right pelvic pain.  None today.  Abnormal bleeding: None Pelvic discharge or pain: None Breast mass, nipple discharge or skin changes : None  Sexually active: yes Birth control: IUD Hx of abnormal PAP: yes, LSIL 2001 Last PAP: 2021 NIL, HPV neg  No results found for: DIAGPAP, HPVHIGH, ADEQPAP Last mammogram: 03/13/21 BIRADS 1 Last colonoscopy: 04/27/21 q61yr  Exercising: no Smoker: no  Garment/textile technologist Visit from 01/10/2024 in [facility]  PHQ-2 Total Score 0      GYN HISTORY: 2001 LSIL  OB History  Gravida Para Term Preterm AB Living  4 4 4   0 4  SAB IAB Ectopic Multiple Live Births    0      # Outcome Date GA Lbr Len/2nd Weight Sex Type Anes PTL Lv  4 Term           3 Term           2 Term           1 Term            Past Medical History:  Diagnosis Date   Chronic shortness of breath 10/11/2022   Gestational diabetes    Hx gestational diabetes 10/30/2013   GDM in one out of 4 pregnancies   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     Cyanocobalamin (VITAMIN B-12 PO) Take 1 tablet by mouth daily.     MAGNESIUM PO Take by mouth.     Omega-3 Fatty Acids (FISH OIL PO) Take 1 tablet by mouth daily.     VITAMIN D PO Take by mouth.     Current Facility-Administered Medications on File Prior to Visit  Medication  Dose Route Frequency Provider Last Rate Last Admin   levonorgestrel  (MIRENA ) 20 MCG/24HR IUD   Intrauterine Once Fontaine, Fatima Honour, MD       Social History   Socioeconomic History   Marital status: Married    Spouse name: Roger   Number of children: 4   Years of education: Not on file   Highest education level: Not on file  Occupational History   Not on file  Tobacco Use   Smoking status: Never   Smokeless tobacco: Never  Vaping Use   Vaping status: Never Used  Substance and Sexual Activity   Alcohol use: Yes    Comment: Rare   Drug use: No   Sexual activity: Yes    Partners: Male    Birth control/protection: I.U.D.    Comment: Mirena  08/29/2017  Other Topics Concern   Not on file  Social History Narrative   Not on file   Social Drivers of Health   Financial Resource Strain: Not on file  Food Insecurity: Not on file  Transportation Needs: Not on file  Physical Activity: Not on file  Stress: Not on file  Social Connections: Unknown (12/08/2021)  Received from [facility]   Social Network    Social Network: Not on file  Intimate Partner Violence: Unknown (10/30/2021)   Received from [facility]   HITS    Physically Hurt: Not on file    Insult or Talk Down To: Not on file    Threaten Physical Harm: Not on file    Scream or Curse: Not on file   Family History  Problem Relation Age of Onset   Osteoporosis Mother    Hypertension Father    Skin cancer Father    Osteoporosis Maternal Grandmother    Diabetes Maternal Grandfather    Breast cancer Paternal Grandmother    Colon polyps Neg Hx    Colon cancer Neg Hx    Esophageal cancer Neg Hx    Rectal cancer Neg Hx    Stomach cancer Neg Hx    No Known Allergies   PE Today's Vitals   01/10/24 0836  BP: 124/78  Pulse: (!) 57  Temp: 97.6 F (36.4 C)  TempSrc: Oral  SpO2: 97%  Weight: 175 lb (79.4 kg)  Height: 5' 6 (1.676 m)   Body mass index is 28.25 kg/m.  Physical Exam Vitals reviewed. Exam  conducted with a chaperone present.  Constitutional:      General: She is not in acute distress.    Appearance: Normal appearance.  HENT:     Head: Normocephalic and atraumatic.     Nose: Nose normal.   Eyes:     Extraocular Movements: Extraocular movements intact.     Conjunctiva/sclera: Conjunctivae normal.   Neck:     Thyroid: No thyroid mass, thyromegaly or thyroid tenderness.  Pulmonary:     Effort: Pulmonary effort is normal.  Chest:     Chest wall: No mass or tenderness.  Breasts:    Right: Normal. No swelling, mass, nipple discharge, skin change or tenderness.     Left: Normal. No swelling, mass, nipple discharge, skin change or tenderness.  Abdominal:     General: There is no distension.     Palpations: Abdomen is soft.     Tenderness: There is no abdominal tenderness.  Genitourinary:    General: Normal vulva.     Exam position: Lithotomy position.     Urethra: No prolapse.     Vagina: Normal. No vaginal discharge or bleeding.     Cervix: Normal. No lesion.     Uterus: Normal. Not enlarged and not tender.      Adnexa: Right adnexa normal and left adnexa normal.   Musculoskeletal:        General: No swelling or tenderness. Normal range of motion.     Cervical back: Normal range of motion.     Comments: No swelling or tenderness of BLE  Lymphadenopathy:     Upper Body:     Right upper body: No axillary adenopathy.     Left upper body: No axillary adenopathy.     Lower Body: No right inguinal adenopathy. No left inguinal adenopathy.   Skin:    General: Skin is warm and dry.   Neurological:     General: No focal deficit present.     Mental Status: She is alert.   Psychiatric:        Mood and Affect: Mood normal.        Behavior: Behavior normal.      Assessment and Plan:        Well woman exam with routine gynecological exam Assessment & Plan: Cervical cancer  screening performed according to ASCCP guidelines. Encouraged annual mammogram  screening Colonoscopy UTD Labs and immunizations with her primary Encouraged safe sexual practices as indicated Encouraged healthy lifestyle practices with diet and exercise For patients under 50-70yo, I recommend 1200mg  calcium daily and 600IU of vitamin D daily.  Pelvic pain -     Urinalysis,Complete w/RFL Culture Continue to monitor  Vasomotor symptoms due to menopause Discussed CBT and meditation as lifestyle modifications that can improve hot flashes. Decreasing caffeine and alcohol intake.  Other microscopic hematuria -     Ambulatory referral to Urology  Negative depression screening  Romaine Closs, MD

## 2024-01-12 ENCOUNTER — Ambulatory Visit: Payer: Self-pay | Admitting: Obstetrics and Gynecology

## 2024-01-12 LAB — URINALYSIS, COMPLETE W/RFL CULTURE
Bilirubin Urine: NEGATIVE
Glucose, UA: NEGATIVE
Hyaline Cast: NONE SEEN /LPF
Ketones, ur: NEGATIVE
Nitrites, Initial: NEGATIVE
Protein, ur: NEGATIVE
Specific Gravity, Urine: 1.02 (ref 1.001–1.035)
pH: 6 (ref 5.0–8.0)

## 2024-01-12 LAB — URINE CULTURE
MICRO NUMBER:: 16590169
Result:: NO GROWTH
SPECIMEN QUALITY:: ADEQUATE

## 2024-01-12 LAB — CULTURE INDICATED

## 2024-02-17 ENCOUNTER — Telehealth: Payer: Self-pay | Admitting: *Deleted

## 2024-02-17 NOTE — Telephone Encounter (Signed)
 Patient left message on triage line requesting to schedule with provider who specializes in estrogen patches for menopausal symptoms.   Per review of EPIC, AEX 01/10/24 with Dr. Dallie. Vasomotor symptoms discussed.   Dr. Dallie -please review and advise

## 2024-02-29 ENCOUNTER — Ambulatory Visit: Admitting: Obstetrics and Gynecology

## 2024-03-21 ENCOUNTER — Ambulatory Visit: Admitting: Obstetrics and Gynecology

## 2024-03-27 NOTE — Telephone Encounter (Signed)
 Per review of EPIC, patient is scheduled to see Dr. Dallie on 04/10/24.   Encounter closed.

## 2024-04-10 ENCOUNTER — Ambulatory Visit: Admitting: Obstetrics and Gynecology

## 2025-01-14 ENCOUNTER — Ambulatory Visit: Admitting: Obstetrics and Gynecology
